# Patient Record
Sex: Female | Born: 1995 | Race: Black or African American | Hispanic: No | Marital: Single | Smoking: Never smoker
Health system: Southern US, Community
[De-identification: ages and names within clinical notes are randomized; demographics above are authoritative.]

## PROBLEM LIST (undated history)

## (undated) HISTORY — PX: APPENDECTOMY: SHX54

---

## 2019-08-21 ENCOUNTER — Other Ambulatory Visit: Payer: Self-pay

## 2019-08-21 ENCOUNTER — Other Ambulatory Visit (HOSPITAL_COMMUNITY)
Admission: RE | Admit: 2019-08-21 | Discharge: 2019-08-21 | Disposition: A | Discharge status: Home / Self Care | Payer: 59 | Source: Ambulatory Visit | Attending: Family Medicine | Admitting: Family Medicine

## 2019-08-21 ENCOUNTER — Encounter: Payer: Self-pay | Admitting: Family Medicine

## 2019-08-21 ENCOUNTER — Ambulatory Visit (INDEPENDENT_AMBULATORY_CARE_PROVIDER_SITE_OTHER): Payer: 59 | Admitting: Family Medicine

## 2019-08-21 DIAGNOSIS — N760 Acute vaginitis: Secondary | ICD-10-CM | POA: Diagnosis not present

## 2019-08-21 DIAGNOSIS — N912 Amenorrhea, unspecified: Secondary | ICD-10-CM | POA: Diagnosis not present

## 2019-08-21 DIAGNOSIS — Z113 Encounter for screening for infections with a predominantly sexual mode of transmission: Secondary | ICD-10-CM | POA: Insufficient documentation

## 2019-08-21 DIAGNOSIS — Z3A08 8 weeks gestation of pregnancy: Secondary | ICD-10-CM

## 2019-08-21 DIAGNOSIS — N898 Other specified noninflammatory disorders of vagina: Secondary | ICD-10-CM

## 2019-08-21 DIAGNOSIS — B9689 Other specified bacterial agents as the cause of diseases classified elsewhere: Secondary | ICD-10-CM

## 2019-08-21 LAB — POCT WET + KOH PREP
Trich by wet prep: ABSENT
Yeast by KOH: ABSENT
Yeast by wet prep: ABSENT

## 2019-08-21 MED ORDER — METRONIDAZOLE 0.75 % VA GEL: 1.0000 | Freq: Two times a day (BID) | VAGINAL | 0 refills | Status: DC

## 2019-08-21 NOTE — Patient Instructions (Addendum)
  Arrange to see an OB/GYN doctor in the near future.  Use the MetroGel vaginal 1 applicator full twice daily in the vagina for bacterial vaginosis  Continue taking your prenatal vitamins.  See your OB/GYN if further problems arise.  STD testing is being performed and will be back in a few days.  If you do not hear from [place] on that in the next week get back to [place].  If you have lab work done today you will be contacted with your lab results within the next 2 weeks.  If you have not heard from [place] then please contact us. The fastest way to get your results is to register for My Chart.   IF you received an x-ray today, you will receive an invoice from [facility]. Please contact [facility] at [phone] with questions or concerns regarding your invoice.   IF you received labwork today, you will receive an invoice from [facility]. Please contact [facility] at [phone] with questions or concerns regarding your invoice.   Our billing staff will not be able to assist you with questions regarding bills from these companies.  You will be contacted with the lab results as soon as they are available. The fastest way to get your results is to activate your My Chart account. Instructions are located on the last page of this paperwork. If you have not heard from [place] regarding the results in 2 weeks, please contact this office.

## 2019-08-21 NOTE — Progress Notes (Signed)
Patient ID: Jillian Hess, female    DOB: 1995/10/22  Age: 24 y.o. MRN: [id number]  Chief Complaint  Patient presents with  . Vaginal Discharge    2 weeks.     Subjective:   2-week history of vaginal discharge with a little bit of a fishy odor at times.  Sometimes it is more, sometimes less.  She is pregnant, probably from 8 to 12 weeks, not sure of her dates as to whether she had a November menstrual cycle or not.  Is no longer with the baby's father.  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  No history of STDs.  Current allergies, medications, problem list, past/family and social histories reviewed.  Objective:  LMP 05/21/2019   No acute distress.  Physical exam not done.  She did the self swab.  Assessment & Plan:   Assessment: 1. Vaginal discharge   2. Bacterial vaginosis   3. Amenorrhea   4. [redacted] weeks gestation of pregnancy   5. Screen for STD (sexually transmitted disease)       Plan: See instruction sheet.  Reviewed up-to-date and use of MetroGel vaginal should be fine.  She has some clue cells, not a lot, but I think her history is consistent with the BV.  Orders Placed This Encounter  Procedures  . HIV Antibody (routine testing w rflx)  . RPR  . POCT Wet + KOH Prep    Meds ordered this encounter  Medications  . metroNIDAZOLE (METROGEL) 0.75 % vaginal gel    Sig: Place 1 Applicatorful vaginally 2 (two) times daily.    Dispense:  70 g    Refill:  0         Patient Instructions    Arrange to see an OB/GYN doctor in the near future.  Use the MetroGel vaginal 1 applicator full twice daily in the vagina for bacterial vaginosis  Continue taking your prenatal vitamins.  See your OB/GYN if further problems arise.  STD testing is being performed and will be back in a few days.  If you do not hear from [place] on that in the next week get back to [place].  If you have lab  work done today you will be contacted with your lab results within the next 2 weeks.  If you have not heard from [place] then please contact us. The fastest way to get your results is to register for My Chart.   IF you received an x-ray today, you will receive an invoice from [facility]. Please contact [facility] at [phone] with questions or concerns regarding your invoice.   IF you received labwork today, you will receive an invoice from [facility]. Please contact [facility] at [phone] with questions or concerns regarding your invoice.   Our billing staff will not be able to assist you with questions regarding bills from these companies.  You will be contacted with the lab results as soon as they are available. The fastest way to get your results is to activate your My Chart account. Instructions are located on the last page of this paperwork. If you have not heard from [place] regarding the results in 2 weeks, please contact this office.         No follow-ups on file.   Janace Hoard, MD 08/21/2019

## 2019-08-22 LAB — HIV ANTIBODY (ROUTINE TESTING W REFLEX): HIV Screen 4th Generation wRfx: NONREACTIVE

## 2019-08-22 LAB — RPR: RPR Ser Ql: NONREACTIVE

## 2019-08-23 LAB — GC/CHLAMYDIA PROBE AMP (~~LOC~~) NOT AT ARMC
Chlamydia: NEGATIVE
Comment: NEGATIVE
Comment: NORMAL
Neisseria Gonorrhea: POSITIVE — AB

## 2019-08-25 ENCOUNTER — Other Ambulatory Visit: Payer: Self-pay

## 2019-08-25 ENCOUNTER — Encounter: Payer: Self-pay | Admitting: Adult Health Nurse Practitioner

## 2019-08-25 ENCOUNTER — Ambulatory Visit (INDEPENDENT_AMBULATORY_CARE_PROVIDER_SITE_OTHER): Payer: 59 | Admitting: Adult Health Nurse Practitioner

## 2019-08-25 VITALS — BP 121/79 | HR 95 | Temp 98.1°F | Ht 64.0 in | Wt 138.4 lb

## 2019-08-25 DIAGNOSIS — R1084 Generalized abdominal pain: Secondary | ICD-10-CM

## 2019-08-25 DIAGNOSIS — O98213 Gonorrhea complicating pregnancy, third trimester: Secondary | ICD-10-CM | POA: Diagnosis not present

## 2019-08-25 DIAGNOSIS — O98211 Gonorrhea complicating pregnancy, first trimester: Secondary | ICD-10-CM

## 2019-08-25 HISTORY — DX: Gonorrhea complicating pregnancy, first trimester: O98.211

## 2019-08-25 MED ORDER — CEFTRIAXONE SODIUM 250 MG IJ SOLR: 250.0000 mg | Freq: Once | INTRAMUSCULAR | Status: DC

## 2019-08-25 MED ORDER — CEFTRIAXONE SODIUM 1 G IJ SOLR: 500.0000 mg | Freq: Once | INTRAMUSCULAR | 0 refills | Status: AC

## 2019-08-25 MED ORDER — CEFTRIAXONE SODIUM 500 MG IJ SOLR
500.0000 mg | Freq: Once | INTRAMUSCULAR | Status: DC
  Administered 2019-08-25: 17:00:00 500 mg via INTRAMUSCULAR

## 2019-08-25 NOTE — Patient Instructions (Signed)
° ° ° °  If you have lab work done today you will be contacted with your lab results within the next 2 weeks.  If you have not heard from us then please contact us. The fastest way to get your results is to register for My Chart. ° ° °IF you received an x-ray today, you will receive an invoice from [facility]. Please contact [facility] at [phone] with questions or concerns regarding your invoice.  ° °IF you received labwork today, you will receive an invoice from [facility]. Please contact [facility] at [phone] with questions or concerns regarding your invoice.  ° °Our billing staff will not be able to assist you with questions regarding bills from these companies. ° °You will be contacted with the lab results as soon as they are available. The fastest way to get your results is to activate your My Chart account. Instructions are located on the last page of this paperwork. If you have not heard from us regarding the results in 2 weeks, please contact this office. °  ° ° ° °

## 2019-08-25 NOTE — Progress Notes (Signed)
   08/25/2019  Jillian Hess November 30, 1995 [id number]  Jillian Hess is an 24 y.o. female  Subjective:   Patient presents for a positive Gonorrhea test.  Per patient, the only person she has had a sexual encounter with is the father of her baby with whom she is no longer with.  She has already called him and informed him of the positive test and his need to be treated.  She is somewhat emotional from being blamed by him for the infection despite having him as her only partner.    She questions whether she should continue the Metrogel.  We discussed she could have both gonorrhea and BV.  Would get the antibiotic injection today and if she still has symptoms of discharge, will continue the metrogel.     Review of Systems  Constitutional: Negative for chills, diaphoresis, fatigue and fever.  Genitourinary: Positive for pelvic pain and vaginal discharge. Negative for dyspareunia, dysuria, frequency, genital sores, hematuria, menstrual problem, urgency, vaginal bleeding and vaginal pain.  Musculoskeletal: Negative for arthralgias, joint swelling and myalgias.  Skin: Negative.   Allergic/Immunologic: Negative for immunocompromised state.  Hematological: Negative.     Physical Exam  General appearance: oriented to person, place, and time. Mental Status: normal mood, behavior, speech, dress, motor activity, and thought processes, affect appropriate to mood, anxious.   Encounter Medications:   Outpatient Encounter Medications as of 08/25/2019  Medication Sig  . metroNIDAZOLE (METROGEL) 0.75 % vaginal gel Place 1 Applicatorful vaginally 2 (two) times daily.  . prenatal vitamin w/FE, FA (PRENATAL 1 + 1) 27-1 MG TABS tablet Take 1 tablet by mouth daily at 12 noon.   Facility-Administered Encounter Medications as of 08/25/2019  Medication  . cefTRIAXone (ROCEPHIN) injection 500 mg     Assessment:    1. Gonorrhea affecting pregnancy in third trimester   2. Generalized abdominal pain       Plan:   Meds ordered this encounter  Medications  . DISCONTD: cefTRIAXone (ROCEPHIN) injection 250 mg  . cefTRIAXone (ROCEPHIN) injection 500 mg   Will f/u in 1 week for TOC, will let OB know that she needs f/u testing at 3 months.  A total of 25 minutes were spent face-to-face with the patient during this encounter and over half of that time was spent on counseling and coordination of care.

## 2019-08-25 NOTE — Addendum Note (Signed)
Addended by: Trudie Reed A on: 08/25/2019 05:29 PM   Modules accepted: Orders

## 2019-08-29 ENCOUNTER — Telehealth: Payer: Self-pay | Admitting: Adult Health Nurse Practitioner

## 2019-08-29 NOTE — Telephone Encounter (Signed)
Spoke with pt regarding her concerns

## 2019-08-29 NOTE — Telephone Encounter (Signed)
Pt wants S. Bryd to give her a call, she will not tell me why. [phone]

## 2019-09-01 ENCOUNTER — Ambulatory Visit (INDEPENDENT_AMBULATORY_CARE_PROVIDER_SITE_OTHER): Payer: 59 | Admitting: Adult Health Nurse Practitioner

## 2019-09-01 ENCOUNTER — Encounter: Payer: Self-pay | Admitting: Adult Health Nurse Practitioner

## 2019-09-01 ENCOUNTER — Other Ambulatory Visit (HOSPITAL_COMMUNITY)
Admission: RE | Admit: 2019-09-01 | Discharge: 2019-09-01 | Disposition: A | Discharge status: Home / Self Care | Payer: 59 | Source: Ambulatory Visit | Attending: Adult Health Nurse Practitioner | Admitting: Adult Health Nurse Practitioner

## 2019-09-01 ENCOUNTER — Other Ambulatory Visit: Payer: Self-pay

## 2019-09-01 VITALS — BP 108/60 | HR 96 | Temp 98.2°F | Ht 64.0 in | Wt 136.8 lb

## 2019-09-01 DIAGNOSIS — A568 Sexually transmitted chlamydial infection of other sites: Secondary | ICD-10-CM

## 2019-09-01 DIAGNOSIS — O98211 Gonorrhea complicating pregnancy, first trimester: Secondary | ICD-10-CM

## 2019-09-01 DIAGNOSIS — O98213 Gonorrhea complicating pregnancy, third trimester: Secondary | ICD-10-CM | POA: Insufficient documentation

## 2019-09-01 LAB — POCT WET + KOH PREP
Trich by wet prep: ABSENT
Yeast by KOH: ABSENT
Yeast by wet prep: ABSENT

## 2019-09-01 NOTE — Progress Notes (Signed)
   09/01/2019  Jillian Hess [birth year] [id number]   History   Chief Complaint  Patient presents with  . Follow-up    Gc/Chlam    HPI   Jillian Hess presents for f/u on her Gonorrhea.  She is [redacted] weeks pregnant.  No complications.  No discomfort.  No dysuria, pelvic pain, vaginal burning or itching.  She has noticed a clay like discharge since being treated.  No bleeding.     OB History    Gravida  1   Para      Term      Preterm      AB      Living        SAB      TAB      Ectopic      Multiple      Live Births             Review of Systems   Review of Systems See HPI Constitution: No fevers or chills No malaise No diaphoresis Skin: No rash or itching Eyes: no blurry vision, no double vision Genito-Urinary ROS: no dysuria, trouble voiding, or hematuria Neuro: no dizziness or headaches   Allergies   Patient has no known allergies.  Home Medications    Current Outpatient Medications:  .  metroNIDAZOLE (METROGEL) 0.75 % vaginal gel, Place 1 Applicatorful vaginally 2 (two) times daily., Disp: 70 g, Rfl: 0 .  prenatal vitamin w/FE, FA (PRENATAL 1 + 1) 27-1 MG TABS tablet, Take 1 tablet by mouth daily at 12 noon., Disp: , Rfl:   Meds Ordered and Administered this Visit  No orders of the defined types were placed in this encounter.   BP 108/60 (BP Location: Left Arm, Patient Position: Sitting, Cuff Size: Normal)   Pulse 96   Temp 98.2 F (36.8 C) (Temporal)   Ht 5\' 4"  (1.626 m)   Wt 136 lb 12.8 oz (62.1 kg)   LMP 05/21/2019   SpO2 98%   BMI 23.48 kg/m   Physical Exam   GEN: WDWN, NAD, Non-toxic, Alert & Oriented x 3 HEENT: Atraumatic, Normocephalic.  Ears and Nose: No external deformity. EXTR: No clubbing/cyanosis/edema NEURO: Normal gait.  PSYCH: Normally interactive. Conversant. Not depressed or anxious appearing.  Calm demeanor.      MDM   1. Gonorrhea affecting pregnancy in third trimester    Orders Placed This  Encounter  Procedures  . POCT Wet + KOH Prep    Will f/u pending results.  The patient and I spent about 10 minutes discussing her pregnancy and medications to use for nausea if needed as well as Tylenol for any discomfort.  We discussed foods during pregnancy and briefly discussed the support in her life which is her family and friends.   A total of 20 minutes were spent face-to-face with the patient during this encounter and over half of that time was spent on counseling and coordination of care.

## 2019-09-04 ENCOUNTER — Other Ambulatory Visit: Payer: Self-pay

## 2019-09-04 ENCOUNTER — Inpatient Hospital Stay (HOSPITAL_COMMUNITY)
Admission: AD | Admit: 2019-09-04 | Discharge: 2019-09-04 | Disposition: A | Discharge status: Home / Self Care | Payer: 59 | Attending: Obstetrics and Gynecology | Admitting: Obstetrics and Gynecology

## 2019-09-04 ENCOUNTER — Inpatient Hospital Stay (HOSPITAL_COMMUNITY): Payer: 59

## 2019-09-04 ENCOUNTER — Encounter (HOSPITAL_COMMUNITY): Payer: Self-pay | Admitting: Obstetrics and Gynecology

## 2019-09-04 DIAGNOSIS — O26891 Other specified pregnancy related conditions, first trimester: Secondary | ICD-10-CM | POA: Diagnosis not present

## 2019-09-04 DIAGNOSIS — N898 Other specified noninflammatory disorders of vagina: Secondary | ICD-10-CM | POA: Diagnosis not present

## 2019-09-04 DIAGNOSIS — R8290 Unspecified abnormal findings in urine: Secondary | ICD-10-CM | POA: Insufficient documentation

## 2019-09-04 DIAGNOSIS — R102 Pelvic and perineal pain: Secondary | ICD-10-CM | POA: Diagnosis present

## 2019-09-04 DIAGNOSIS — O209 Hemorrhage in early pregnancy, unspecified: Secondary | ICD-10-CM

## 2019-09-04 DIAGNOSIS — Z3A10 10 weeks gestation of pregnancy: Secondary | ICD-10-CM

## 2019-09-04 LAB — URINALYSIS, ROUTINE W REFLEX MICROSCOPIC
Bilirubin Urine: NEGATIVE
Glucose, UA: NEGATIVE mg/dL
Ketones, ur: 5 mg/dL — AB
Nitrite: NEGATIVE
Protein, ur: NEGATIVE mg/dL
Specific Gravity, Urine: 1.017 (ref 1.005–1.030)
pH: 7 (ref 5.0–8.0)

## 2019-09-04 LAB — CBC
HCT: 34.9 % — ABNORMAL LOW (ref 36.0–46.0)
Hemoglobin: 11.9 g/dL — ABNORMAL LOW (ref 12.0–15.0)
MCH: 32.1 pg (ref 26.0–34.0)
MCHC: 34.1 g/dL (ref 30.0–36.0)
MCV: 94.1 fL (ref 80.0–100.0)
Platelets: 178 10*3/uL (ref 150–400)
RBC: 3.71 MIL/uL — ABNORMAL LOW (ref 3.87–5.11)
RDW: 13.2 % (ref 11.5–15.5)
WBC: 6.7 10*3/uL (ref 4.0–10.5)
nRBC: 0 % (ref 0.0–0.2)

## 2019-09-04 LAB — WET PREP, GENITAL
Clue Cells Wet Prep HPF POC: NONE SEEN
Sperm: NONE SEEN
Trich, Wet Prep: NONE SEEN
Yeast Wet Prep HPF POC: NONE SEEN

## 2019-09-04 LAB — ABO/RH: ABO/RH(D): B POS

## 2019-09-04 LAB — POCT PREGNANCY, URINE: Preg Test, Ur: POSITIVE — AB

## 2019-09-04 LAB — HCG, QUANTITATIVE, PREGNANCY: hCG, Beta Chain, Quant, S: 242801 m[IU]/mL — ABNORMAL HIGH (ref <5)

## 2019-09-04 MED ORDER — ACETAMINOPHEN 325 MG PO TABS: 650.0000 mg | ORAL_TABLET | Freq: Four times a day (QID) | ORAL | 3 refills | Status: AC | PRN

## 2019-09-04 MED ORDER — ACETAMINOPHEN 325 MG PO TABS
650.0000 mg | ORAL_TABLET | Freq: Four times a day (QID) | ORAL | Status: DC | PRN
  Administered 2019-09-04: 650 mg via ORAL
  Filled 2019-09-04: qty 2

## 2019-09-04 NOTE — MAU Note (Signed)
Pt is a G1P0 at 10.1 weeks per [place] at 6 weeks.  Pt has had brown "chocolate syrup" discharge and pelvic pain for the last week.  Today the discharge increased.

## 2019-09-04 NOTE — ED Provider Notes (Signed)
MSE was initiated and I personally evaluated the patient and placed orders (if any) at  11:42 AM on September 04, 2019.  The patient appears stable so that the remainder of the MSE may be completed by another provider.  I spoke with Sam from MAU.  Patient is stable and vitals are normal and appears well.  Patient to be transferred over to MAU for further care   Arlyn Dunning, PA-C 09/04/19 1142    Arby Barrette, MD 09/11/19 1436

## 2019-09-04 NOTE — ED Triage Notes (Signed)
Pt reports brown colored vaginal discharge for the past 1.5 weeks as well as pelvic pain for 1 week. Pt about [redacted] weeks pregnant. Pt denies n/v.

## 2019-09-04 NOTE — MAU Note (Signed)
Sent up from ER, early preg with pain.  Has been seen at [facility] clinic with preg 3times last month.  Has had [place] at [facility] 8/29 from that. Unsure of LMP.  Having pelvic pain, start about 10days ago,constant. No bleeding, says d/c looks like chocolate syrup, "dark brown and slimy"

## 2019-09-04 NOTE — MAU Provider Note (Addendum)
History    Chief Complaint  Patient presents with  . Pelvic Pain  . Vaginal Discharge   Jillian Hess a 24 yo G1P0 woman presents today due to pelvic pain and vaginal discharge x 1.5 weeks. She describes the pelvic pain as low and feels like she's "fallen down stairs;" her discharge is thick and dark brown, she describes it like "chocolate syrup." She is 10w pregnant and was sexually active with 1 partner. She was diagnosed with gonorrhea earlier this month by family medicine and was treated with Ceftriaxone 500 mg IM x1 and metrogel for suspected BV, which she used for 3-4 days. Her LMP that she is sure of was in October, she cannot remember if she had a menstrual period in November. 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 Patient initially had a home pregnancy test on 12/17, and then had 2 [place] at [facility] on 08/08/19 and 08/15/19. PMH is otherwise non contributory, appendectomy in 2014 or 2015, NKDA, and her only medication is PNV.   Pertinent Gynecological History: Menses: irregular occurring approximately every 30-35 days without intermenstrual spotting Bleeding: n/a Contraception: none DES exposure: denies Blood transfusions: none Sexually transmitted diseases: recent diagnosis: gonorrhea in January 2021 Previous GYN Procedures: none  Last mammogram: n/a Last pap: n/a   Past Medical History:  Diagnosis Date  . Gonorrhea affecting pregnancy in first trimester 08/25/2019    History reviewed. No pertinent surgical history.  History reviewed. No pertinent family history.  Social History   Tobacco Use  . Smoking status: Never Smoker  . Smokeless tobacco: Never Used  Substance Use Topics  . Alcohol use: Not Currently  . Drug use: Not on file    Allergies: No Known Allergies  Medications Prior to Admission  Medication Sig Dispense Refill Last Dose  . metroNIDAZOLE  (METROGEL) 0.75 % vaginal gel Place 1 Applicatorful vaginally 2 (two) times daily. 70 g 0   . prenatal vitamin w/FE, FA (PRENATAL 1 + 1) 27-1 MG TABS tablet Take 1 tablet by mouth daily at 12 noon.       Review of Systems  Constitutional: Negative.   HENT: Negative.   Eyes: Negative.   Respiratory: Negative.   Cardiovascular: Negative.   Gastrointestinal: Negative for abdominal distention, abdominal pain, constipation, nausea and vomiting.  Endocrine: Negative.   Genitourinary: Positive for frequency, pelvic pain and vaginal discharge. Negative for dysuria, hematuria, urgency, vaginal bleeding and vaginal pain.  Musculoskeletal: Negative.   Skin: Negative.   Allergic/Immunologic: Negative.   Neurological: Negative.   Hematological: Negative.   Psychiatric/Behavioral: Negative.    Physical Exam Blood pressure 116/77, pulse 78, temperature 98.2 F (36.8 C), temperature source Oral, resp. rate 17, height 5\' 4"  (1.626 m), weight 62.5 kg, last menstrual period 05/21/2019, SpO2 100 %. Physical Exam  Constitutional: She appears well-developed and well-nourished. No distress.  HENT:  Head: Normocephalic and atraumatic.  Eyes: Conjunctivae are normal.  Cardiovascular: Normal rate, regular rhythm and normal heart sounds. Exam reveals no gallop and no friction rub.  No murmur heard. Respiratory: Effort normal and breath sounds normal. No respiratory distress. She has no wheezes. She has no rales.  GI: Soft. Bowel sounds are normal. She exhibits no distension. There is no abdominal tenderness. There is no rebound and no guarding.  Genitourinary:    Genitourinary Comments: PELVIC:  Normal appearing external female genitalia, normal vaginal epithelium, copious grayish discharge. Normal  appearing cervix, closed external os    Skin: She is not diaphoretic.   CBC    Component Value Date/Time   WBC 6.7 09/04/2019 1223   RBC 3.71 (L) 09/04/2019 1223   HGB 11.9 (L) 09/04/2019 1223   HCT 34.9  (L) 09/04/2019 1223   PLT 178 09/04/2019 1223   MCV 94.1 09/04/2019 1223   MCH 32.1 09/04/2019 1223   MCHC 34.1 09/04/2019 1223   RDW 13.2 09/04/2019 1223    Urinalysis    Component Value Date/Time   COLORURINE YELLOW 09/04/2019 1206   APPEARANCEUR CLOUDY (A) 09/04/2019 1206   LABSPEC 1.017 09/04/2019 1206   PHURINE 7.0 09/04/2019 1206   GLUCOSEU NEGATIVE 09/04/2019 1206   HGBUR MODERATE (A) 09/04/2019 1206   BILIRUBINUR NEGATIVE 09/04/2019 1206   KETONESUR 5 (A) 09/04/2019 1206   PROTEINUR NEGATIVE 09/04/2019 1206   NITRITE NEGATIVE 09/04/2019 1206   LEUKOCYTESUR MODERATE (A) 09/04/2019 1206   US OB Comp Less 14 Wks  Result Date: 09/04/2019 CLINICAL DATA:  Pregnant patient with vaginal bleeding for 1 week. Exam for ultrasound dating. EXAM: OBSTETRIC <14 WK ULTRASOUND TECHNIQUE: Transabdominal ultrasound was performed for evaluation of the gestation as well as the maternal uterus and adnexal regions. COMPARISON:  None. FINDINGS: Intrauterine gestational sac: Single. Yolk sac:  Visualized. Embryo:  Visualized. Cardiac Activity: Detected. Heart Rate: 171 bpm CRL: 36.1  mm   10 w 4 d                  [place] EDC: 03/28/2020 Subchorionic hemorrhage:  None visualized. Maternal uterus/adnexae: Corpus luteum cyst on the left noted. IMPRESSION: Single living intrauterine pregnancy.  No acute finding. Electronically Signed   By: Drusilla Kanner M.D.   On: 09/04/2019 13:58    MAU Course Vaginal discharge in first trimester pregnancy Closed external os. GC/CT pending. Wet prep without yeast, BV, or trichomonas, but many WBCs.  Acetaminophen given with improvement in pelvic pain.   US reveals normal intrauterine pregnancy. Patient ok for discharge and to follow up at initial prenatal visit on 09/13/19 at [facility].  Shirlean Mylar, MD [facility] Residency, PGY-1  I confirm that I have verified the information documented in the resident's note and that I have also personally  supervised and reperformed the history, physical exam and all medical decision making activities of this service and have verified that all service and findings are accurately documented in this student's note.   --SIUP at [redacted]w[redacted]d  --GCC pending --Blood type B POS --Abnormal UA with squam epithelial cells present, urine culture ordered --Discharge in stable condition  F/U: --New OB [facility]   [place], [place] 09/04/2019 3:10 PM

## 2019-09-04 NOTE — Discharge Instructions (Signed)
First Trimester of Pregnancy  The first trimester of pregnancy is from week 1 until the end of week 13 (months 1 through 3). During this time, your baby will begin to develop inside you. At 6-8 weeks, the eyes and face are formed, and the heartbeat can be seen on ultrasound. At the end of 12 weeks, all the baby's organs are formed. Prenatal care is all the medical care you receive before the birth of your baby. Make sure you get good prenatal care and follow all of your doctor's instructions. Follow these instructions at home: Medicines  Take over-the-counter and prescription medicines only as told by your doctor. Some medicines are safe and some medicines are not safe during pregnancy.  Take a prenatal vitamin that contains at least 600 micrograms (mcg) of folic acid.  If you have trouble pooping (constipation), take medicine that will make your stool soft (stool softener) if your doctor approves. Eating and drinking   Eat regular, healthy meals.  Your doctor will tell you the amount of weight gain that is right for you.  Avoid raw meat and uncooked cheese.  If you feel sick to your stomach (nauseous) or throw up (vomit): ? Eat 4 or 5 small meals a day instead of 3 large meals. ? Try eating a few soda crackers. ? Drink liquids between meals instead of during meals.  To prevent constipation: ? Eat foods that are high in fiber, like fresh fruits and vegetables, whole grains, and beans. ? Drink enough fluids to keep your pee (urine) clear or pale yellow. Activity  Exercise only as told by your doctor. Stop exercising if you have cramps or pain in your lower belly (abdomen) or low back.  Do not exercise if it is too hot, too humid, or if you are in a place of great height (high altitude).  Try to avoid standing for long periods of time. Move your legs often if you must stand in one place for a long time.  Avoid heavy lifting.  Wear low-heeled shoes. Sit and stand up  straight.  You can have sex unless your doctor tells you not to. Relieving pain and discomfort  Wear a good support bra if your breasts are sore.  Take warm water baths (sitz baths) to soothe pain or discomfort caused by hemorrhoids. Use hemorrhoid cream if your doctor says it is okay.  Rest with your legs raised if you have leg cramps or low back pain.  If you have puffy, bulging veins (varicose veins) in your legs: ? Wear support hose or compression stockings as told by your doctor. ? Raise (elevate) your feet for 15 minutes, 3-4 times a day. ? Limit salt in your food. Prenatal care  Schedule your prenatal visits by the twelfth week of pregnancy.  Write down your questions. Take them to your prenatal visits.  Keep all your prenatal visits as told by your doctor. This is important. Safety  Wear your seat belt at all times when driving.  Make a list of emergency phone numbers. The list should include numbers for family, friends, the hospital, and police and fire departments. General instructions  Ask your doctor for a referral to a local prenatal class. Begin classes no later than at the start of month 6 of your pregnancy.  Ask for help if you need counseling or if you need help with nutrition. Your doctor can give you advice or tell you where to go for help.  Do not use hot tubs, steam   rooms, or saunas.  Do not douche or use tampons or scented sanitary pads.  Do not cross your legs for long periods of time.  Avoid all herbs and alcohol. Avoid drugs that are not approved by your doctor.  Do not use any tobacco products, including cigarettes, chewing tobacco, and electronic cigarettes. If you need help quitting, ask your doctor. You may get counseling or other support to help you quit.  Avoid cat litter boxes and soil used by cats. These carry germs that can cause birth defects in the baby and can cause a loss of your baby (miscarriage) or stillbirth.  Visit your dentist.  At home, brush your teeth with a soft toothbrush. Be gentle when you floss. Contact a doctor if:  You are dizzy.  You have mild cramps or pressure in your lower belly.  You have a nagging pain in your belly area.  You continue to feel sick to your stomach, you throw up, or you have watery poop (diarrhea).  You have a bad smelling fluid coming from your vagina.  You have pain when you pee (urinate).  You have increased puffiness (swelling) in your face, hands, legs, or ankles. Get help right away if:  You have a fever.  You are leaking fluid from your vagina.  You have spotting or bleeding from your vagina.  You have very bad belly cramping or pain.  You gain or lose weight rapidly.  You throw up blood. It may look like coffee grounds.  You are around people who have German measles, fifth disease, or chickenpox.  You have a very bad headache.  You have shortness of breath.  You have any kind of trauma, such as from a fall or a car accident. Summary  The first trimester of pregnancy is from week 1 until the end of week 13 (months 1 through 3).  To take care of yourself and your unborn baby, you will need to eat healthy meals, take medicines only if your doctor tells you to do so, and do activities that are safe for you and your baby.  Keep all follow-up visits as told by your doctor. This is important as your doctor will have to ensure that your baby is healthy and growing well. This information is not intended to replace advice given to you by your health care provider. Make sure you discuss any questions you have with your health care provider. Document Revised: 11/10/2018 Document Reviewed: 07/28/2016 Elsevier Patient Education  2020 [employer]  Safe Medications in Pregnancy   Acne: Benzoyl Peroxide Salicylic Acid  Backache/Headache: Tylenol: 2 regular strength every 4 hours OR              2 Extra strength every 6  hours  Colds/Coughs/Allergies: Benadryl (alcohol free) 25 mg every 6 hours as needed Breath right strips Claritin Cepacol throat lozenges Chloraseptic throat spray Cold-Eeze- up to three times per day Cough drops, alcohol free Flonase (by prescription only) Guaifenesin Mucinex Robitussin DM (plain only, alcohol free) Saline nasal spray/drops Sudafed (pseudoephedrine) & Actifed ** use only after [redacted] weeks gestation and if you do not have high blood pressure Tylenol Vicks Vaporub Zinc lozenges Zyrtec   Constipation: Colace Ducolax suppositories Fleet enema Glycerin suppositories Metamucil Milk of magnesia Miralax Senokot Smooth move tea  Diarrhea: Kaopectate Imodium A-D  *NO pepto Bismol  Hemorrhoids: Anusol Anusol HC Preparation H Tucks  Indigestion: Tums Maalox Mylanta Zantac  Pepcid  Insomnia: Benadryl (alcohol free) 25mg every 6 hours as needed   Tylenol PM Unisom, no Gelcaps  Leg Cramps: Tums MagGel  Nausea/Vomiting:  Bonine Dramamine Emetrol Ginger extract Sea bands Meclizine  Nausea medication to take during pregnancy:  Unisom (doxylamine succinate 25 mg tablets) Take one tablet daily at bedtime. If symptoms are not adequately controlled, the dose can be increased to a maximum recommended dose of two tablets daily (1/2 tablet in the morning, 1/2 tablet mid-afternoon and one at bedtime). Vitamin B6 100mg tablets. Take one tablet twice a day (up to 200 mg per day).  Skin Rashes: Aveeno products Benadryl cream or 25mg every 6 hours as needed Calamine Lotion 1% cortisone cream  Yeast infection: Gyne-lotrimin 7 Monistat 7   **If taking multiple medications, please check labels to avoid duplicating the same active ingredients **take medication as directed on the label ** Do not exceed 4000 mg of tylenol in 24 hours **Do not take medications that contain aspirin or ibuprofen   

## 2019-09-05 ENCOUNTER — Ambulatory Visit (INDEPENDENT_AMBULATORY_CARE_PROVIDER_SITE_OTHER): Payer: 59

## 2019-09-05 DIAGNOSIS — Z3401 Encounter for supervision of normal first pregnancy, first trimester: Secondary | ICD-10-CM

## 2019-09-05 DIAGNOSIS — Z3400 Encounter for supervision of normal first pregnancy, unspecified trimester: Secondary | ICD-10-CM | POA: Insufficient documentation

## 2019-09-05 LAB — GC/CHLAMYDIA PROBE AMP (~~LOC~~) NOT AT ARMC
Chlamydia: NEGATIVE
Chlamydia: NEGATIVE
Comment: NEGATIVE
Comment: NORMAL
Comment: NEGATIVE
Comment: NORMAL
Neisseria Gonorrhea: NEGATIVE
Neisseria Gonorrhea: NEGATIVE

## 2019-09-05 LAB — CULTURE, OB URINE: Culture: <10000 — AB

## 2019-09-05 MED ORDER — BLOOD PRESSURE KIT DEVI: 1.0000 | 0 refills | Status: AC

## 2019-09-05 NOTE — Progress Notes (Signed)
I connected with  Jillian Hess on 09/05/19 by a video enabled telemedicine application and verified that I am speaking with the correct person using two identifiers.   I discussed the limitations of evaluation and management by telemedicine. The patient expressed understanding and agreed to proceed.  PRENATAL INTAKE SUMMARY  Jillian Hess presents today New OB Nurse Interview.  OB History    Gravida  1   Para  0   Term      Preterm      AB      Living        SAB      TAB      Ectopic      Multiple      Live Births             I have reviewed the patient's medical, obstetrical, social, and family histories, medications, and available lab results.  SUBJECTIVE She has no unusual complaints  OBJECTIVE Initial Physical Exam (New OB)  GENERAL APPEARANCE: alert, well appearing, oriented to person, place and time   ASSESSMENT Normal pregnancy  PLAN Prenatal care BP CUFF Ordered

## 2019-09-13 ENCOUNTER — Other Ambulatory Visit (HOSPITAL_COMMUNITY)
Admission: RE | Admit: 2019-09-13 | Discharge: 2019-09-13 | Disposition: A | Discharge status: Home / Self Care | Payer: 59 | Source: Ambulatory Visit | Attending: Advanced Practice Midwife | Admitting: Advanced Practice Midwife

## 2019-09-13 ENCOUNTER — Ambulatory Visit (INDEPENDENT_AMBULATORY_CARE_PROVIDER_SITE_OTHER): Payer: Medicaid Other | Admitting: Advanced Practice Midwife

## 2019-09-13 ENCOUNTER — Encounter: Payer: Self-pay | Admitting: Advanced Practice Midwife

## 2019-09-13 ENCOUNTER — Other Ambulatory Visit: Payer: Self-pay

## 2019-09-13 DIAGNOSIS — O98211 Gonorrhea complicating pregnancy, first trimester: Secondary | ICD-10-CM

## 2019-09-13 DIAGNOSIS — Z3400 Encounter for supervision of normal first pregnancy, unspecified trimester: Secondary | ICD-10-CM

## 2019-09-13 DIAGNOSIS — Z3481 Encounter for supervision of other normal pregnancy, first trimester: Secondary | ICD-10-CM

## 2019-09-13 DIAGNOSIS — Z3A31 31 weeks gestation of pregnancy: Secondary | ICD-10-CM

## 2019-09-13 NOTE — Progress Notes (Signed)
NEW OB.  Declined FLU vaccine.  Reports no problems today.

## 2019-09-13 NOTE — Progress Notes (Signed)
History:   Alene Bergerson is a 24 y.o. G1P0 at 48w3dby 10 week ultrasound in [facility]  being seen today for her first obstetrical visit.  Her obstetrical history is significant for hx of STI. Patient does intend to breast feed and is interested in water birth. Pregnancy history fully reviewed.  Patient reports no complaints. She is not currently sexually active. She lives with three roommates and her sister is her primary support person. She is a non-smoker, student, having difficulty making time for self-care during [place] feeling very positive about her pregnancy.      HISTORY: OB History  Gravida Para Term Preterm AB Living  1 0 0 0 0 0  SAB TAB Ectopic Multiple Live Births  0 0 0 0 0    # Outcome Date GA Lbr Len/2nd Weight Sex Delivery Anes PTL Lv  1 Current            No pap history (age 24  Past Medical History:  Diagnosis Date  . Gonorrhea affecting pregnancy in first trimester 08/25/2019   Past Surgical History:  Procedure Laterality Date  . APPENDECTOMY     Family History  Problem Relation Age of Onset  . Cancer Maternal Grandfather    Social History   Tobacco Use  . Smoking status: Never Smoker  . Smokeless tobacco: Never Used  Substance Use Topics  . Alcohol use: Not Currently  . Drug use: Never   No Known Allergies Current Outpatient Medications on File Prior to Visit  Medication Sig Dispense Refill  . Blood Pressure Monitoring (BLOOD PRESSURE KIT) DEVI 1 kit by Does not apply route once a week. Check Blood Pressure regularly and record readings into the Babyscripts App.  Large Cuff.  DX O90.0 1 each 0  . prenatal vitamin w/FE, FA (PRENATAL 1 + 1) 27-1 MG TABS tablet Take 1 tablet by mouth daily at 12 noon.    .Marland Kitchenacetaminophen (TYLENOL) 325 MG tablet Take 2 tablets (650 mg total) by mouth every 6 (six) hours as needed for moderate pain. (Patient not taking: Reported on 09/13/2019) 30 tablet 3  . metroNIDAZOLE (METROGEL) 0.75 % vaginal gel Place 1 Applicatorful  vaginally 2 (two) times daily. (Patient not taking: Reported on 09/05/2019) 70 g 0   No current facility-administered medications on file prior to visit.    Review of Systems Pertinent items noted in HPI and remainder of comprehensive ROS otherwise negative. Physical Exam:   Vitals:   09/13/19 0943  BP: 128/78  Pulse: 94  Temp: 98.6 F (37 C)  Weight: 141 lb (64 kg)     Uterus:     Pelvic Exam: Perineum: no hemorrhoids, normal perineum   Vulva: normal external genitalia, no lesions   Vagina:  normal mucosa, normal discharge   Cervix: no lesions and normal, pap smear done.    Adnexa: normal adnexa and no mass, fullness, tenderness   Bony Pelvis: average  System: General: well-developed, well-nourished female in no acute distress   Breasts:  normal appearance, no masses or tenderness bilaterally   Skin: normal coloration and turgor, no rashes   Neurologic: oriented, normal, negative, normal mood   Extremities: normal strength, tone, and muscle mass, ROM of all joints is normal   HEENT PERRLA, extraocular movement intact and sclera clear, anicteric   Mouth/Teeth mucous membranes moist, pharynx normal without lesions and dental hygiene good   Neck supple and no masses   Cardiovascular: regular rate and rhythm   Respiratory:  no  respiratory distress, normal breath sounds   Abdomen: soft, non-tender; bowel sounds normal; no masses,  no organomegaly  Bedside Ultrasound for FHR check: Patient informed that the ultrasound is considered a limited obstetric ultrasound and is not intended to be a complete ultrasound exam.  Patient also informed that the ultrasound is not being completed with the intent of assessing for fetal or placental anomalies or any pelvic abnormalities.  Explained that the purpose of today's ultrasound is to assess for fetal heart rate.  Patient acknowledges the purpose of the exam and the limitations of the study.    FHT 165, very active movement noted and visible to  patient!!   Assessment:    Pregnancy: G1P0 Patient Active Problem List   Diagnosis Date Noted  . Supervision of normal first pregnancy 09/05/2019  . Gonorrhea affecting pregnancy in first trimester 08/25/2019     Plan:    1. Supervision of normal first pregnancy, antepartum - LOB - Previously given first trimester precautions and safe medications in pregnancy  - Enroll Patient in Babyscripts - Babyscripts Schedule Optimization - Obstetric Panel, Including HIV - Genetic Screening - Culture, OB Urine - Cytology - PAP( [place]) - [place] MFM OB COMP + 14 WK; Future - Cervicovaginal ancillary only( [place])  2. Gonorrhea affecting pregnancy in first trimester - Negative TOC in [facility] 09/04/2019  Initial labs drawn. Continue prenatal vitamins. Genetic Screening discussed, First trimester screen, Quad screen and NIPS: ordered. Ultrasound discussed; fetal anatomic survey: ordered. Problem list reviewed and updated. The nature of [place] with multiple MDs and other Advanced Practice Providers was explained to patient; also emphasized that residents, students are part of our team. Routine obstetric precautions reviewed. Return in about 4 weeks (around 10/11/2019) for Virtual, midwife preferred.    Mallie Snooks, MSN, CNM Certified Nurse Midwife, [employer] for [employer], [facility] 09/13/19 12:08 PM

## 2019-09-13 NOTE — Patient Instructions (Addendum)

## 2019-09-14 ENCOUNTER — Other Ambulatory Visit: Payer: Self-pay | Admitting: Advanced Practice Midwife

## 2019-09-14 ENCOUNTER — Encounter: Payer: Self-pay | Admitting: Advanced Practice Midwife

## 2019-09-14 DIAGNOSIS — A599 Trichomoniasis, unspecified: Secondary | ICD-10-CM

## 2019-09-14 LAB — CERVICOVAGINAL ANCILLARY ONLY
Bacterial Vaginitis (gardnerella): NEGATIVE
Candida Glabrata: NEGATIVE
Candida Vaginitis: NEGATIVE
Chlamydia: NEGATIVE
Comment: NEGATIVE
Comment: NEGATIVE
Comment: NEGATIVE
Comment: NEGATIVE
Comment: NEGATIVE
Comment: NORMAL
Neisseria Gonorrhea: NEGATIVE
Trichomonas: POSITIVE — AB

## 2019-09-14 LAB — OBSTETRIC PANEL, INCLUDING HIV
Antibody Screen: NEGATIVE
Basophils Absolute: 0 10*3/uL (ref 0.0–0.2)
Basos: 0 %
EOS (ABSOLUTE): 0 10*3/uL (ref 0.0–0.4)
Eos: 1 %
HIV Screen 4th Generation wRfx: NONREACTIVE
Hematocrit: 35.9 % (ref 34.0–46.6)
Hemoglobin: 12.3 g/dL (ref 11.1–15.9)
Hepatitis B Surface Ag: NEGATIVE
Immature Grans (Abs): 0 10*3/uL (ref 0.0–0.1)
Immature Granulocytes: 0 %
Lymphocytes Absolute: 1.1 10*3/uL (ref 0.7–3.1)
Lymphs: 14 %
MCH: 32.4 pg (ref 26.6–33.0)
MCHC: 34.3 g/dL (ref 31.5–35.7)
MCV: 95 fL (ref 79–97)
Monocytes Absolute: 0.4 10*3/uL (ref 0.1–0.9)
Monocytes: 5 %
Neutrophils Absolute: 6.2 10*3/uL (ref 1.4–7.0)
Neutrophils: 80 %
Platelets: 178 10*3/uL (ref 150–450)
RBC: 3.8 x10E6/uL (ref 3.77–5.28)
RDW: 13.1 % (ref 11.7–15.4)
RPR Ser Ql: NONREACTIVE
Rh Factor: POSITIVE
Rubella Antibodies, IGG: 8.27 index (ref >0.99)
WBC: 7.8 10*3/uL (ref 3.4–10.8)

## 2019-09-14 LAB — CYTOLOGY - PAP
Comment: NEGATIVE
Diagnosis: NEGATIVE
High risk HPV: NEGATIVE

## 2019-09-14 MED ORDER — METRONIDAZOLE 500 MG PO TABS: ORAL_TABLET | ORAL | 0 refills | Status: DC

## 2019-09-14 NOTE — Progress Notes (Signed)
+   Trichomonas at new ob.  Notification via phone. Patient verbalized concern that previous swabs have not been positive for Trich and she has been abstinent for about 40 days. Discussed with patient that method used at [facility] is reliable, low suspicion for false position. Treatment advised. Patient agreeable at end of phone call.  Clayton Bibles, MSN, CNM Certified Nurse Midwife, [facility] for [employer], [facility] 09/14/19 12:48 PM

## 2019-09-14 NOTE — Progress Notes (Unsigned)
See separate note.

## 2019-09-17 LAB — URINE CULTURE, OB REFLEX

## 2019-09-17 LAB — CULTURE, OB URINE

## 2019-09-25 ENCOUNTER — Encounter: Payer: Self-pay | Admitting: Advanced Practice Midwife

## 2019-09-26 ENCOUNTER — Encounter: Payer: Self-pay | Admitting: Advanced Practice Midwife

## 2019-10-12 ENCOUNTER — Other Ambulatory Visit: Payer: Self-pay

## 2019-10-12 ENCOUNTER — Other Ambulatory Visit (HOSPITAL_COMMUNITY)
Admission: RE | Admit: 2019-10-12 | Discharge: 2019-10-12 | Disposition: A | Discharge status: Home / Self Care | Payer: 59 | Source: Ambulatory Visit | Attending: Certified Nurse Midwife | Admitting: Certified Nurse Midwife

## 2019-10-12 ENCOUNTER — Ambulatory Visit (INDEPENDENT_AMBULATORY_CARE_PROVIDER_SITE_OTHER): Payer: 59 | Admitting: Advanced Practice Midwife

## 2019-10-12 VITALS — BP 119/74 | HR 109 | Wt 140.4 lb

## 2019-10-12 DIAGNOSIS — O23591 Infection of other part of genital tract in pregnancy, first trimester: Secondary | ICD-10-CM

## 2019-10-12 DIAGNOSIS — A5901 Trichomonal vulvovaginitis: Secondary | ICD-10-CM | POA: Insufficient documentation

## 2019-10-12 DIAGNOSIS — Z3A15 15 weeks gestation of pregnancy: Secondary | ICD-10-CM

## 2019-10-12 DIAGNOSIS — Z3400 Encounter for supervision of normal first pregnancy, unspecified trimester: Secondary | ICD-10-CM

## 2019-10-12 NOTE — Progress Notes (Signed)
   PRENATAL VISIT NOTE  Subjective:  Jillian Hess is a 24 y.o. G1P0 at [redacted]w[redacted]d being seen today for ongoing prenatal care.  She is currently monitored for the following issues for this low-risk pregnancy and has Gonorrhea affecting pregnancy in first trimester; Supervision of normal first pregnancy; and Trichomonas infection on their problem list.  Patient reports no complaints.  Contractions: Not present. Vag. Bleeding: None.   . Denies leaking of fluid.   The following portions of the patient's history were reviewed and updated as appropriate: allergies, current medications, past family history, past medical history, past social history, past surgical history and problem list.   Objective:   Vitals:   10/12/19 0858  BP: 119/74  Pulse: (!) 109  Weight: 140 lb 6.4 oz (63.7 kg)    Fetal Status:           General:  Alert, oriented and cooperative. Patient is in no acute distress.  Skin: Skin is warm and dry. No rash noted.   Cardiovascular: Normal heart rate noted  Respiratory: Normal respiratory effort, no problems with respiration noted  Abdomen: Soft, gravid, appropriate for gestational age.  Pain/Pressure: Absent     Pelvic: Cervical exam deferred        Extremities: Normal range of motion.  Edema: None  Mental Status: Normal mood and affect. Normal behavior. Normal judgment and thought content.   Assessment and Plan:  Pregnancy: G1P0 at [redacted]w[redacted]d 1. Supervision of normal first pregnancy, antepartum --Anticipatory guidance about next visits/weeks of pregnancy given. --No concerns or complaints, doing well --Complete genetic screening today with AFP - AFP, Serum, Open Spina Bifida  2. Trichomonal vaginitis during pregnancy in first trimester --TOC today - Cervicovaginal ancillary only( [place])  Preterm labor symptoms and general obstetric precautions including but not limited to vaginal bleeding, contractions, leaking of fluid and fetal movement were reviewed in detail with  the patient. Please refer to After Visit Summary for other counseling recommendations.   No follow-ups on file.  Future Appointments  Date Time Provider Department Center  10/12/2019  9:15 AM [place], Wilmer Floor, CNM [facility] None  11/06/2019 12:45 PM [facility] [place] 2 [facility]    Sharen Counter, CNM

## 2019-10-12 NOTE — Patient Instructions (Signed)

## 2019-10-12 NOTE — Progress Notes (Signed)
Patient presents for ROB. Patient has no concerns.  

## 2019-10-14 LAB — AFP, SERUM, OPEN SPINA BIFIDA
AFP MoM: 1.76
AFP Value: 58.9 ng/mL
Gest. Age on Collection Date: 15 weeks
Maternal Age At EDD: 22.4 yr
OSBR Risk 1 IN: 2814
Test Results:: NEGATIVE
Weight: 140 [lb_av]

## 2019-10-18 LAB — CERVICOVAGINAL ANCILLARY ONLY
Comment: NEGATIVE
Trichomonas: POSITIVE — AB

## 2019-10-20 ENCOUNTER — Telehealth: Payer: Self-pay | Admitting: Advanced Practice Midwife

## 2019-10-20 DIAGNOSIS — A5901 Trichomonal vulvovaginitis: Secondary | ICD-10-CM

## 2019-10-20 DIAGNOSIS — O23592 Infection of other part of genital tract in pregnancy, second trimester: Secondary | ICD-10-CM

## 2019-10-20 MED ORDER — METRONIDAZOLE 500 MG PO TABS: 500.0000 mg | ORAL_TABLET | Freq: Two times a day (BID) | ORAL | 0 refills | Status: AC

## 2019-10-20 NOTE — Telephone Encounter (Signed)
Called pt to inform her of positive TOC for trichomonas in pregnancy. Pt treated 09/14/19 for positive test. Pt reports she has not been sexually active since 1 month prior to her 09/14/19 exam.  Retreat with Flagyl 500 mg BID x 7 days. TOC in 3 weeks in the office. Pt states understanding.

## 2019-11-06 ENCOUNTER — Other Ambulatory Visit (HOSPITAL_COMMUNITY): Payer: Self-pay | Admitting: *Deleted

## 2019-11-06 ENCOUNTER — Ambulatory Visit (HOSPITAL_COMMUNITY)
Admission: RE | Admit: 2019-11-06 | Discharge: 2019-11-06 | Disposition: A | Discharge status: Home / Self Care | Payer: 59 | Source: Ambulatory Visit | Attending: Advanced Practice Midwife | Admitting: Advanced Practice Midwife

## 2019-11-06 ENCOUNTER — Other Ambulatory Visit: Payer: Self-pay

## 2019-11-06 DIAGNOSIS — Z3A19 19 weeks gestation of pregnancy: Secondary | ICD-10-CM

## 2019-11-06 DIAGNOSIS — O43192 Other malformation of placenta, second trimester: Secondary | ICD-10-CM

## 2019-11-06 DIAGNOSIS — Z363 Encounter for antenatal screening for malformations: Secondary | ICD-10-CM | POA: Diagnosis not present

## 2019-11-06 DIAGNOSIS — Z3400 Encounter for supervision of normal first pregnancy, unspecified trimester: Secondary | ICD-10-CM | POA: Insufficient documentation

## 2019-11-07 ENCOUNTER — Encounter: Payer: Self-pay | Admitting: Advanced Practice Midwife

## 2019-11-07 DIAGNOSIS — O43192 Other malformation of placenta, second trimester: Secondary | ICD-10-CM | POA: Insufficient documentation

## 2019-11-08 ENCOUNTER — Telehealth (INDEPENDENT_AMBULATORY_CARE_PROVIDER_SITE_OTHER): Payer: 59 | Admitting: Obstetrics

## 2019-11-08 ENCOUNTER — Encounter: Payer: Self-pay | Admitting: Obstetrics

## 2019-11-08 DIAGNOSIS — O98211 Gonorrhea complicating pregnancy, first trimester: Secondary | ICD-10-CM

## 2019-11-08 DIAGNOSIS — O0990 Supervision of high risk pregnancy, unspecified, unspecified trimester: Secondary | ICD-10-CM

## 2019-11-08 DIAGNOSIS — O43192 Other malformation of placenta, second trimester: Secondary | ICD-10-CM

## 2019-11-08 DIAGNOSIS — Z3A19 19 weeks gestation of pregnancy: Secondary | ICD-10-CM

## 2019-11-08 DIAGNOSIS — O98212 Gonorrhea complicating pregnancy, second trimester: Secondary | ICD-10-CM

## 2019-11-08 DIAGNOSIS — O23592 Infection of other part of genital tract in pregnancy, second trimester: Secondary | ICD-10-CM

## 2019-11-08 DIAGNOSIS — A5901 Trichomonal vulvovaginitis: Secondary | ICD-10-CM

## 2019-11-08 NOTE — Progress Notes (Signed)
OBSTETRICS PRENATAL VIRTUAL VISIT ENCOUNTER NOTE  Provider location: [facility] [place] [facility] [place]   I connected with Jillian Hess on 11/08/19 at  4:15 PM EDT by MyChart Video Encounter at home and verified that I am speaking with the correct person using two identifiers.   I discussed the limitations, risks, security and privacy concerns of performing an evaluation and management service virtually and the availability of in person appointments. I also discussed with the patient that there may be a patient responsible charge related to this service. The patient expressed understanding and agreed to proceed. Subjective:  Jillian Hess is a 24 y.o. G1P0 at [redacted]w[redacted]d being seen today for ongoing prenatal care.  She is currently monitored for the following issues for this high-risk pregnancy and has Gonorrhea affecting pregnancy in first trimester; Supervision of normal first pregnancy; Trichomonas infection; and Marginal insertion of umbilical cord affecting management of mother in second trimester on their problem list.  Patient reports no complaints.  Contractions: Not present. Vag. Bleeding: None.   . Denies any leaking of fluid.   The following portions of the patient's history were reviewed and updated as appropriate: allergies, current medications, past family history, past medical history, past social history, past surgical history and problem list.   Objective:  There were no vitals filed for this visit.  Fetal Status:           General:  Alert, oriented and cooperative. Patient is in no acute distress.  Respiratory: Normal respiratory effort, no problems with respiration noted  Mental Status: Normal mood and affect. Normal behavior. Normal judgment and thought content.  Rest of physical exam deferred due to type of encounter  Imaging: [place] MFM OB COMP + 14 WK  Result Date: 11/06/2019 ----------------------------------------------------------------------  OBSTETRICS REPORT                        (Signed Final 11/06/2019 03:14 pm) ---------------------------------------------------------------------- Patient Info  ID #:       [id number]                          D.O.B.:  [birth year] (24 yrs)  Name:       Jillian Hess                   Visit Date: 11/06/2019 12:49 pm ---------------------------------------------------------------------- Performed By  Performed By:     Hubert Azure          Referred By:      Irene Limbo  Attending:        Johnell Comings MD         [facility] ---------------------------------------------------------------------- Orders   #  Description  Code         Ordered By   1  [place] MFM OB COMP + 14 WK               [id number]     Clayton Bibles  ----------------------------------------------------------------------   #  Order #                    Accession #                 Episode #   1  [id number]                  [id number]                  [id number]  ---------------------------------------------------------------------- Indications   Encounter for antenatal screening for          Z36.3   malformations (neg AFP, low risk Nips, neg   horizon)   [redacted] weeks gestation of pregnancy                Z3A.19  ---------------------------------------------------------------------- Fetal Evaluation  Num Of Fetuses:         1  Fetal Heart Rate(bpm):  154  Cardiac Activity:       Observed  Presentation:           Cephalic  Placenta:               Anterior  P. Cord Insertion:      Marginal insertion  Amniotic Fluid  AFI FV:      Within normal limits                              Largest Pocket(cm)                              6.05 ---------------------------------------------------------------------- Biometry  BPD:      46.7  mm     G. Age:  20w 1d          73  %    CI:        74.83   %    70 - 86                                                          FL/HC:      17.5   %    16.8 - 19.8  HC:      171.3  mm     G. Age:  19w 5d         50  %    HC/AC:      1.10        1.09 - 1.39  AC:      155.7  mm     G. Age:  20w 5d         81  %  FL/BPD:     64.0   %  FL:       29.9  mm     G. Age:  19w 2d         30  %    FL/AC:      19.2   %    20 - 24  HUM:      28.6  mm     G. Age:  19w 2d         44  %  CER:      20.6  mm     G. Age:  19w 4d         51  %  NFT:       5.3  mm  LV:        6.9  mm  CM:          5  mm  Est. FW:     327  gm    0 lb 12 oz      72  % ---------------------------------------------------------------------- OB History  Gravidity:    1 ---------------------------------------------------------------------- Gestational Age  LMP:           24w 1d        Date:  05/21/19                 EDD:   02/25/20  U/S Today:     20w 0d                                        EDD:   03/25/20  Best:          19w 4d     Det. By:  Previous Ultrasound      EDD:   03/28/20                                      (09/04/19) ---------------------------------------------------------------------- Anatomy  Cranium:               Appears normal         LVOT:                   Appears normal  Cavum:                 Appears normal         Aortic Arch:            Appears normal  Ventricles:            Appears normal         Ductal Arch:            Appears normal  Choroid Plexus:        Appears normal         Diaphragm:              Appears normal  Cerebellum:            Appears normal         Stomach:                Appears normal, left  sided  Posterior Fossa:       Appears normal         Abdomen:                Appears normal  Nuchal Fold:           Appears normal         Abdominal Wall:         Appears nml (cord                                                                        insert, abd wall)  Face:                   Appears normal         Cord Vessels:           Appears normal (3                         (orbits and profile)                           vessel cord)  Lips:                  Appears normal         Kidneys:                Appear normal  Palate:                Appears normal         Bladder:                Appears normal  Thoracic:              Appears normal         Spine:                  Limited views                                                                        appear normal  Heart:                 Appears normal         Upper Extremities:      Appears normal                         (4CH, axis, and                         situs)  RVOT:                  Appears normal         Lower Extremities:      Appears normal  Other:  Heels and 5th digit visualized. Open hands visualized. Nasal bone  visualized. Fetus appears to be a female. ---------------------------------------------------------------------- Cervix Uterus Adnexa  Cervix  Length:           3.99  cm.  Normal appearance by transabdominal scan.  Uterus  No abnormality visualized.  Left Ovary  Within normal limits.  Right Ovary  Not visualized.  Adnexa  No abnormality visualized. ---------------------------------------------------------------------- Comments  This patient was seen for a detailed fetal anatomy scan.  She denies any significant past medical history and denies  any problems in her current pregnancy.  She had a cell free DNA test earlier in her pregnancy which  indicated a low risk for trisomy 73, 63, and 13. A female fetus is  predicted.  She was informed that the fetal growth and amniotic fluid  level were appropriate for her gestational age.  There were no obvious fetal anomalies noted on today's  ultrasound exam.  The patient was informed that anomalies may be missed due  to technical limitations. If the fetus is in a suboptimal position  or maternal habitus is increased, visualization of the fetus in  the maternal uterus may  be impaired.  A marginal placental cord insertion was noted today.  The  small association between a marginal placental cord insertion  and fetal growth issues later in her pregnancy was discussed.  Due to this indication, we will continue to follow her with serial  growth ultrasounds.  A follow-up exam was scheduled in 4 weeks to assess the  fetal growth and to obtain better views of the fetal spine. ----------------------------------------------------------------------                   Ma Rings, MD Electronically Signed Final Report   11/06/2019 03:14 pm ----------------------------------------------------------------------   Assessment and Plan:  Pregnancy: G1P0 at [redacted]w[redacted]d  1. Supervision of high risk pregnancy, antepartum  2. Gonorrhea affecting pregnancy in first trimester  3. Trichomonal vaginitis during pregnancy in second trimester  4. Marginal insertion of umbilical cord affecting management of mother in second trimester   Preterm labor symptoms and general obstetric precautions including but not limited to vaginal bleeding, contractions, leaking of fluid and fetal movement were reviewed in detail with the patient. I discussed the assessment and treatment plan with the patient. The patient was provided an opportunity to ask questions and all were answered. The patient agreed with the plan and demonstrated an understanding of the instructions. The patient was advised to call back or seek an in-person office evaluation/go to MAU at [facility] for any urgent or concerning symptoms. Please refer to After Visit Summary for other counseling recommendations.   I provided 10 minutes of face-to-face time during this encounter.  Return in about 4 weeks (around 12/06/2019) for MyChart [facility]Faculty Only.  Future Appointments  Date Time Provider Department Center  12/04/2019  1:45 PM [facility] NURSE [facility]  12/04/2019  1:45 PM [facility] [place] 5 [facility]-M[facility]US [facility]-US    Coral Ceo,  MD [facility], [facility] 11/08/2019

## 2019-11-08 NOTE — Progress Notes (Signed)
Pt is on the phone preparing for virtual visit with provider. [redacted]w[redacted]d. Pt reports BP cuff is too big, advised pt go to [employer] and switch it out, pt voices understanding. Pt denies any HA, blurry vision, swelling or sharp abdominal pain.

## 2019-12-04 ENCOUNTER — Ambulatory Visit (HOSPITAL_COMMUNITY): Payer: 59 | Attending: Obstetrics and Gynecology

## 2019-12-04 ENCOUNTER — Other Ambulatory Visit: Payer: Self-pay | Admitting: *Deleted

## 2019-12-04 ENCOUNTER — Ambulatory Visit: Payer: 59 | Admitting: *Deleted

## 2019-12-04 ENCOUNTER — Other Ambulatory Visit: Payer: Self-pay

## 2019-12-04 VITALS — BP 112/76 | HR 82 | Temp 97.4°F

## 2019-12-04 DIAGNOSIS — O43199 Other malformation of placenta, unspecified trimester: Secondary | ICD-10-CM

## 2019-12-04 DIAGNOSIS — O43192 Other malformation of placenta, second trimester: Secondary | ICD-10-CM

## 2019-12-04 DIAGNOSIS — Z362 Encounter for other antenatal screening follow-up: Secondary | ICD-10-CM | POA: Insufficient documentation

## 2019-12-04 DIAGNOSIS — Z3A23 23 weeks gestation of pregnancy: Secondary | ICD-10-CM | POA: Insufficient documentation

## 2019-12-06 ENCOUNTER — Telehealth (INDEPENDENT_AMBULATORY_CARE_PROVIDER_SITE_OTHER): Payer: 59 | Admitting: Obstetrics and Gynecology

## 2019-12-06 ENCOUNTER — Encounter: Payer: Self-pay | Admitting: Obstetrics and Gynecology

## 2019-12-06 NOTE — Progress Notes (Signed)
Patient unavailable for virtual visit. Several messages left

## 2019-12-12 ENCOUNTER — Encounter: Payer: Self-pay | Admitting: Obstetrics

## 2019-12-12 ENCOUNTER — Telehealth (INDEPENDENT_AMBULATORY_CARE_PROVIDER_SITE_OTHER): Payer: 59 | Admitting: Obstetrics

## 2019-12-12 DIAGNOSIS — Z3400 Encounter for supervision of normal first pregnancy, unspecified trimester: Secondary | ICD-10-CM

## 2019-12-12 DIAGNOSIS — O43192 Other malformation of placenta, second trimester: Secondary | ICD-10-CM

## 2019-12-12 DIAGNOSIS — M549 Dorsalgia, unspecified: Secondary | ICD-10-CM

## 2019-12-12 DIAGNOSIS — Z3A24 24 weeks gestation of pregnancy: Secondary | ICD-10-CM

## 2019-12-12 DIAGNOSIS — A599 Trichomoniasis, unspecified: Secondary | ICD-10-CM

## 2019-12-12 MED ORDER — COMFORT FIT MATERNITY SUPP SM MISC: 0 refills | Status: AC

## 2019-12-12 NOTE — Progress Notes (Signed)
Virtual Visit via Telephone Note  I connected with Jillian Hess on 12/12/19 at  4:15 PM EDT by telephone and verified that I am speaking with the correct person using two identifiers.  Pt having issues with BP cuff; pt will bring cuff to next visit Needs TOC Trich - unsure if partner was treated

## 2019-12-12 NOTE — Progress Notes (Signed)
OBSTETRICS PRENATAL VIRTUAL VISIT ENCOUNTER NOTE  Provider location: [facility] at [place]   I connected with Jillian Hess on 12/12/19 at  4:15 PM EDT by MyChart Video Encounter at home and verified that I am speaking with the correct person using two identifiers.   I discussed the limitations, risks, security and privacy concerns of performing an evaluation and management service virtually and the availability of in person appointments. I also discussed with the patient that there may be a patient responsible charge related to this service. The patient expressed understanding and agreed to proceed. Subjective:  Jillian Hess is a 24 y.o. G1P0 at [redacted]w[redacted]d being seen today for ongoing prenatal care.  She is currently monitored for the following issues for this high-risk pregnancy and has Gonorrhea affecting pregnancy in first trimester; Supervision of normal first pregnancy; Trichomonas infection; and Marginal insertion of umbilical cord affecting management of mother in second trimester on their problem list.  Patient reports backache.  Contractions: Not present. Vag. Bleeding: None.  Movement: Present. Denies any leaking of fluid.   The following portions of the patient's history were reviewed and updated as appropriate: allergies, current medications, past family history, past medical history, past social history, past surgical history and problem list.   Objective:  There were no vitals filed for this visit.  Fetal Status:     Movement: Present     General:  Alert, oriented and cooperative. Patient is in no acute distress.  Respiratory: Normal respiratory effort, no problems with respiration noted  Mental Status: Normal mood and affect. Normal behavior. Normal judgment and thought content.  Rest of physical exam deferred due to type of encounter  Imaging: [place] MFM OB FOLLOW UP  Result Date:  12/04/2019 ----------------------------------------------------------------------  OBSTETRICS REPORT                    (Corrected Final 12/04/2019 03:11 pm) ---------------------------------------------------------------------- Patient Info  ID #:       [id number]                          D.O.B.:  Jul 30, 1996 (24 yrs)  Name:       Jillian Hess                   Visit Date: 12/04/2019 02:06 pm ---------------------------------------------------------------------- Performed By  Attending:        Noralee Space MD        Referred By:      Calvert Cantor  Performed By:     Tommie Raymond BS,       [facility]                    RDMS, RVT                                Fetal Care ---------------------------------------------------------------------- Orders  #  Description  Code        Ordered By  1  [place] MFM OB FOLLOW UP                   [id number]    Rosana Hoes ----------------------------------------------------------------------  #  Order #                     Accession #                Episode #  1  [id number]                   [id number]                 [id number] ---------------------------------------------------------------------- Indications  [redacted] weeks gestation of pregnancy                Z3A.23  Encounter for other antenatal screening        Z36.2  follow-up  Antenatal follow-up for nonvisualized fetal    Z36.2  anatomy ---------------------------------------------------------------------- Fetal Evaluation  Num Of Fetuses:         1  Fetal Heart Rate(bpm):  155  Cardiac Activity:       Observed  Presentation:           Cephalic  Placenta:               Anterior  P. Cord Insertion:      Marginal insertion prev.  Amniotic Fluid  AFI FV:      Within normal limits                              Largest Pocket(cm)                              5.1 ----------------------------------------------------------------------  Biometry  BPD:      58.3  mm     G. Age:  23w 6d         56  %    CI:        75.92   %    70 - 86                                                          FL/HC:      19.0   %    18.7 - 20.9  HC:      212.1  mm     G. Age:  23w 2d         23  %    HC/AC:      1.12        1.05 - 1.21  AC:      189.1  mm     G. Age:  23w 5d         44  %    FL/BPD:     69.3   %    71 - 87  FL:       40.4  mm     G. Age:  23w 0d         23  %    FL/AC:      21.4   %  20 - 24  HUM:      37.2  mm     G. Age:  23w 0d         30  %  CER:      26.5  mm     G. Age:  24w 2d         62  %  LV:        5.8  mm  CM:        7.5  mm  Est. FW:     592  gm      1 lb 5 oz     34  % ---------------------------------------------------------------------- OB History  Gravidity:    1 ---------------------------------------------------------------------- Gestational Age  LMP:           28w 1d        Date:  05/21/19                 EDD:   02/25/20  U/S Today:     23w 3d                                        EDD:   03/29/20  Best:          23w 4d     Det. By:  Previous Ultrasound      EDD:   03/28/20                                      (09/04/19) ---------------------------------------------------------------------- Anatomy  Cranium:               Appears normal         LVOT:                   Appears normal  Cavum:                 Previously seen        Aortic Arch:            Appears normal  Ventricles:            Appears normal         Ductal Arch:            Appears normal  Choroid Plexus:        Appears normal         Diaphragm:              Appears normal  Cerebellum:            Appears normal         Stomach:                Appears normal, left                                                                        sided  Posterior Fossa:       Previously seen        Abdomen:  Appears normal  Nuchal Fold:           Previously seen        Abdominal Wall:         Previously seen  Face:                  Orbits and profile     Cord  Vessels:           Previously seen                         previously seen  Lips:                  Previously seen        Kidneys:                Appear normal  Palate:                Previously seen        Bladder:                Appears normal  Thoracic:              Previously seen        Spine:                  Appears normal  Heart:                 Appears normal         Upper Extremities:      Previously seen                         (4CH, axis, and                         situs)  RVOT:                  Appears normal         Lower Extremities:      Previously seen  Other:  Heels and 5th digit visualized prev. Open hands visualized prev.          Nasal bone visualized prev. Female gender previously seen. ---------------------------------------------------------------------- Cervix Uterus Adnexa  Cervix  Not adaquately visualized  Uterus  No abnormality visualized.  Right Ovary  Within normal limits. No adnexal mass visualized.  Left Ovary  Within normal limits. No adnexal mass visualized.  Cul De Sac  No free fluid seen.  Adnexa  No abnormality visualized. ---------------------------------------------------------------------- Impression  Patient returned for completion of fetal anatomy .Fetal  biometry is consistent with her previously-established dates  .Amniotic fluid is normal and good fetal activity is seen .Fetal  anatomical survey (spine) was completed and appears  normal. Marginal cord insertion is seen again. ---------------------------------------------------------------------- Recommendations  -An appointment was made for her to return in 5 weeks for  fetal growth assessment. ed. ----------------------------------------------------------------------                       Noralee Spaceavi Shankar, MD Electronically Signed Corrected Final Report  12/04/2019 03:11 pm ----------------------------------------------------------------------   Assessment and Plan:  Pregnancy: G1P0 at 7539w5d 1. Supervision of normal  first pregnancy, antepartum  2. Marginal insertion of umbilical cord affecting management of mother in second trimester - followed by MFM  3. Trichomonas infection Rx: - Cervicovaginal ancillary only  4. Backache symptom Rx: -  Elastic Bandages & Supports (COMFORT FIT MATERNITY SUPP SM) MISC; Wear as directed.  Dispense: 1 each; Refill: 0   Preterm labor symptoms and general obstetric precautions including but not limited to vaginal bleeding, contractions, leaking of fluid and fetal movement were reviewed in detail with the patient. I discussed the assessment and treatment plan with the patient. The patient was provided an opportunity to ask questions and all were answered. The patient agreed with the plan and demonstrated an understanding of the instructions. The patient was advised to call back or seek an in-person office evaluation/go to MAU at [facility] for any urgent or concerning symptoms. Please refer to After Visit Summary for other counseling recommendations.   I provided 10 minutes of face-to-face time during this encounter.  Return in about 4 weeks (around 01/09/2020) for ROB, 2 hour OGTT.  Future Appointments  Date Time Provider [place]  12/12/2019  4:15 PM Shelly Bombard, MD [facility] None  01/04/2020  8:15 AM [facility] LAB [facility] None  01/04/2020  8:30 AM Shelly Bombard, MD [place] None  01/09/2020 10:45 AM [facility] NURSE [facility]  01/09/2020 10:45 AM [facility] US4 [facility] [place], [place] for [facility] [place] [facility] 12/12/2019

## 2020-01-04 ENCOUNTER — Other Ambulatory Visit (HOSPITAL_COMMUNITY)
Admission: RE | Admit: 2020-01-04 | Discharge: 2020-01-04 | Disposition: A | Discharge status: Home / Self Care | Payer: 59 | Source: Ambulatory Visit | Attending: Obstetrics | Admitting: Obstetrics

## 2020-01-04 ENCOUNTER — Other Ambulatory Visit: Payer: Self-pay

## 2020-01-04 ENCOUNTER — Ambulatory Visit (INDEPENDENT_AMBULATORY_CARE_PROVIDER_SITE_OTHER): Payer: 59 | Admitting: Obstetrics

## 2020-01-04 ENCOUNTER — Encounter: Payer: Self-pay | Admitting: Obstetrics

## 2020-01-04 ENCOUNTER — Other Ambulatory Visit: Payer: 59

## 2020-01-04 VITALS — BP 111/74 | HR 82 | Wt 152.0 lb

## 2020-01-04 DIAGNOSIS — O98213 Gonorrhea complicating pregnancy, third trimester: Secondary | ICD-10-CM | POA: Diagnosis not present

## 2020-01-04 DIAGNOSIS — Z3400 Encounter for supervision of normal first pregnancy, unspecified trimester: Secondary | ICD-10-CM

## 2020-01-04 DIAGNOSIS — O43192 Other malformation of placenta, second trimester: Secondary | ICD-10-CM | POA: Diagnosis not present

## 2020-01-04 DIAGNOSIS — Z3A28 28 weeks gestation of pregnancy: Secondary | ICD-10-CM

## 2020-01-04 DIAGNOSIS — B75 Trichinellosis: Secondary | ICD-10-CM | POA: Diagnosis present

## 2020-01-04 DIAGNOSIS — A599 Trichomoniasis, unspecified: Secondary | ICD-10-CM | POA: Diagnosis not present

## 2020-01-04 DIAGNOSIS — O0990 Supervision of high risk pregnancy, unspecified, unspecified trimester: Secondary | ICD-10-CM

## 2020-01-04 DIAGNOSIS — O98211 Gonorrhea complicating pregnancy, first trimester: Secondary | ICD-10-CM

## 2020-01-04 NOTE — Progress Notes (Signed)
Subjective:  Jillian Hess is a 24 y.o. G1P0 at [redacted]w[redacted]d being seen today for ongoing prenatal care.  She is currently monitored for the following issues for this high-risk pregnancy and has Gonorrhea affecting pregnancy in first trimester; Supervision of normal first pregnancy; Trichomonas infection; and Marginal insertion of umbilical cord affecting management of mother in second trimester on their problem list.  Patient reports no complaints.  Contractions: Irritability. Vag. Bleeding: None.  Movement: Present. Denies leaking of fluid.   The following portions of the patient's history were reviewed and updated as appropriate: allergies, current medications, past family history, past medical history, past social history, past surgical history and problem list. Problem list updated.  Objective:   Vitals:   01/04/20 0836  BP: 111/74  Pulse: 82  Weight: 152 lb (68.9 kg)    Fetal Status:     Movement: Present     General:  Alert, oriented and cooperative. Patient is in no acute distress.  Skin: Skin is warm and dry. No rash noted.   Cardiovascular: Normal heart rate noted  Respiratory: Normal respiratory effort, no problems with respiration noted  Abdomen: Soft, gravid, appropriate for gestational age. Pain/Pressure: Present     Pelvic:  Cervical exam deferred        Extremities: Normal range of motion.     Mental Status: Normal mood and affect. Normal behavior. Normal judgment and thought content.   Urinalysis:      Assessment and Plan:  Pregnancy: G1P0 at [redacted]w[redacted]d  1. Supervision of high risk pregnancy, antepartum Rx: - Glucose Tolerance, 2 Hours w/1 Hour - CBC - HIV Antibody (routine testing w rflx) - RPR  2. Marginal insertion of umbilical cord affecting management of mother in second trimester - ultrasound follow up by MFM  3. Trichomonas infection Rx: - Cervicovaginal ancillary only( [place]) - TOC today  4. Gonorrhea affecting pregnancy in first trimester, treated -  TOC today   Preterm labor symptoms and general obstetric precautions including but not limited to vaginal bleeding, contractions, leaking of fluid and fetal movement were reviewed in detail with the patient. Please refer to After Visit Summary for other counseling recommendations.   Return in about 2 weeks (around 01/18/2020) for MyChart.   Brock Bad, MD  01/04/20

## 2020-01-04 NOTE — Progress Notes (Signed)
Pt states pelvic pressure and period like cramps.

## 2020-01-05 ENCOUNTER — Other Ambulatory Visit: Payer: Self-pay | Admitting: Obstetrics

## 2020-01-05 DIAGNOSIS — D696 Thrombocytopenia, unspecified: Secondary | ICD-10-CM

## 2020-01-05 LAB — GLUCOSE TOLERANCE, 2 HOURS W/ 1HR
Glucose, 1 hour: 109 mg/dL (ref 65–179)
Glucose, 2 hour: 88 mg/dL (ref 65–152)
Glucose, Fasting: 70 mg/dL (ref 65–91)

## 2020-01-05 LAB — CBC
Hematocrit: 34.8 % (ref 34.0–46.6)
Hemoglobin: 11.5 g/dL (ref 11.1–15.9)
MCH: 31.7 pg (ref 26.6–33.0)
MCHC: 33 g/dL (ref 31.5–35.7)
MCV: 96 fL (ref 79–97)
Platelets: 145 10*3/uL — ABNORMAL LOW (ref 150–450)
RBC: 3.63 x10E6/uL — ABNORMAL LOW (ref 3.77–5.28)
RDW: 12.9 % (ref 11.7–15.4)
WBC: 8.1 10*3/uL (ref 3.4–10.8)

## 2020-01-05 LAB — CERVICOVAGINAL ANCILLARY ONLY
Bacterial Vaginitis (gardnerella): NEGATIVE
Candida Glabrata: NEGATIVE
Candida Vaginitis: POSITIVE — AB
Chlamydia: NEGATIVE
Comment: NEGATIVE
Comment: NEGATIVE
Comment: NEGATIVE
Comment: NEGATIVE
Comment: NEGATIVE
Comment: NORMAL
Neisseria Gonorrhea: NEGATIVE
Trichomonas: NEGATIVE

## 2020-01-05 LAB — RPR: RPR Ser Ql: NONREACTIVE

## 2020-01-05 LAB — HIV ANTIBODY (ROUTINE TESTING W REFLEX): HIV Screen 4th Generation wRfx: NONREACTIVE

## 2020-01-06 ENCOUNTER — Other Ambulatory Visit: Payer: Self-pay | Admitting: Obstetrics

## 2020-01-06 DIAGNOSIS — B3731 Acute candidiasis of vulva and vagina: Secondary | ICD-10-CM

## 2020-01-06 MED ORDER — TERCONAZOLE 0.4 % VA CREA: 1.0000 | TOPICAL_CREAM | Freq: Every day | VAGINAL | 0 refills | Status: AC

## 2020-01-09 ENCOUNTER — Ambulatory Visit: Payer: 59 | Attending: Obstetrics and Gynecology

## 2020-01-09 ENCOUNTER — Other Ambulatory Visit: Payer: Self-pay

## 2020-01-09 ENCOUNTER — Ambulatory Visit: Payer: 59 | Admitting: *Deleted

## 2020-01-09 ENCOUNTER — Other Ambulatory Visit: Payer: Self-pay | Admitting: *Deleted

## 2020-01-09 VITALS — BP 114/77 | HR 95

## 2020-01-09 DIAGNOSIS — Z362 Encounter for other antenatal screening follow-up: Secondary | ICD-10-CM

## 2020-01-09 DIAGNOSIS — O43199 Other malformation of placenta, unspecified trimester: Secondary | ICD-10-CM | POA: Insufficient documentation

## 2020-01-09 DIAGNOSIS — O43193 Other malformation of placenta, third trimester: Secondary | ICD-10-CM

## 2020-01-09 DIAGNOSIS — O99119 Other diseases of the blood and blood-forming organs and certain disorders involving the immune mechanism complicating pregnancy, unspecified trimester: Secondary | ICD-10-CM | POA: Diagnosis not present

## 2020-01-09 DIAGNOSIS — Z3A28 28 weeks gestation of pregnancy: Secondary | ICD-10-CM

## 2020-01-09 DIAGNOSIS — D696 Thrombocytopenia, unspecified: Secondary | ICD-10-CM

## 2020-01-18 ENCOUNTER — Encounter: Payer: 59 | Admitting: Women's Health

## 2020-01-29 ENCOUNTER — Encounter: Payer: 59 | Admitting: Obstetrics and Gynecology

## 2020-02-08 ENCOUNTER — Other Ambulatory Visit: Payer: Self-pay | Admitting: *Deleted

## 2020-02-08 ENCOUNTER — Ambulatory Visit: Payer: 59 | Admitting: *Deleted

## 2020-02-08 ENCOUNTER — Other Ambulatory Visit: Payer: Self-pay

## 2020-02-08 ENCOUNTER — Ambulatory Visit: Payer: 59 | Attending: Obstetrics and Gynecology

## 2020-02-08 VITALS — BP 120/81 | HR 95

## 2020-02-08 DIAGNOSIS — O0990 Supervision of high risk pregnancy, unspecified, unspecified trimester: Secondary | ICD-10-CM | POA: Diagnosis present

## 2020-02-08 DIAGNOSIS — D696 Thrombocytopenia, unspecified: Secondary | ICD-10-CM

## 2020-02-08 DIAGNOSIS — Z3A33 33 weeks gestation of pregnancy: Secondary | ICD-10-CM

## 2020-02-08 DIAGNOSIS — Z362 Encounter for other antenatal screening follow-up: Secondary | ICD-10-CM

## 2020-02-08 DIAGNOSIS — O99119 Other diseases of the blood and blood-forming organs and certain disorders involving the immune mechanism complicating pregnancy, unspecified trimester: Secondary | ICD-10-CM

## 2020-02-08 DIAGNOSIS — O43193 Other malformation of placenta, third trimester: Secondary | ICD-10-CM | POA: Diagnosis not present

## 2020-03-07 ENCOUNTER — Ambulatory Visit: Payer: 59

## 2022-01-28 IMAGING — US US MFM OB COMP +14 WKS
2 series · 13 of 28 positions shown · non-contrast
Comparison: none

[Series 1: us mfm ob comp +14 wks · 120 acquisitions, 11 frames shown (1 of 2)]
[im 6/120]
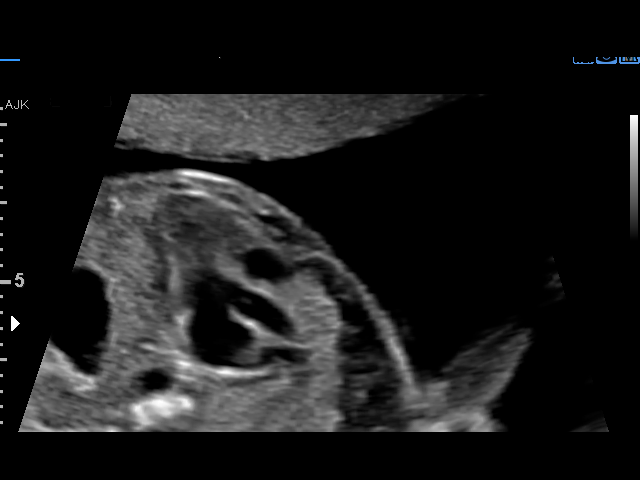
[im 16/120]
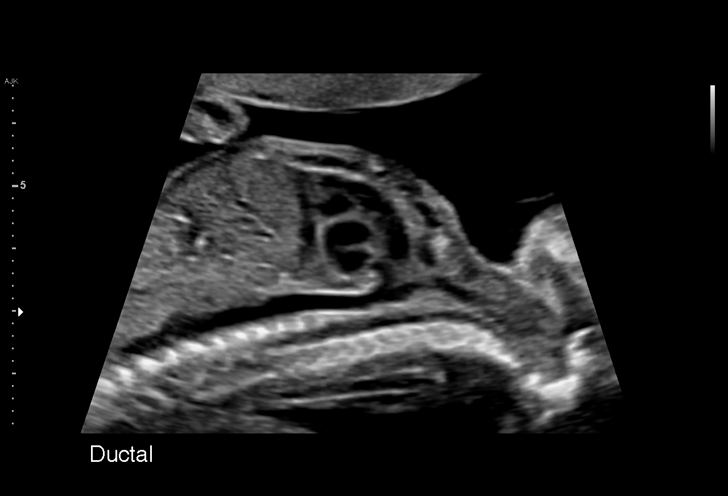
[im 26/120]
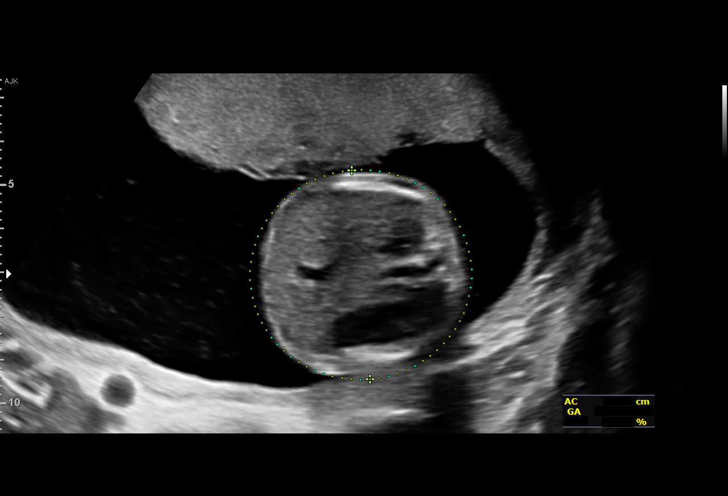
[im 37/120]
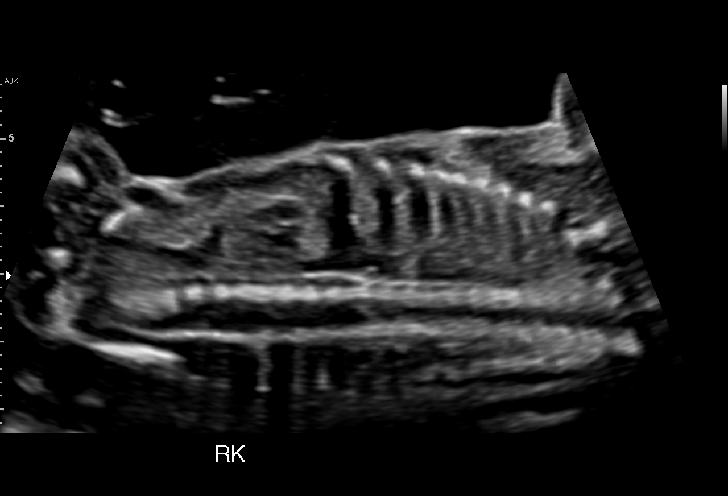
[im 47/120]
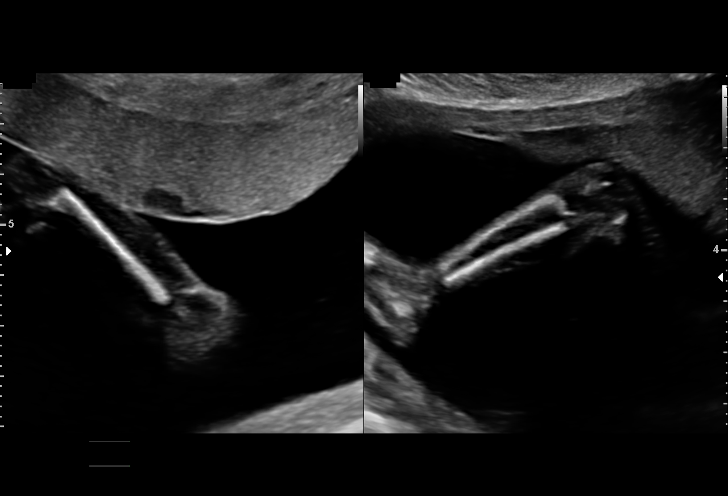
[im 57/120]
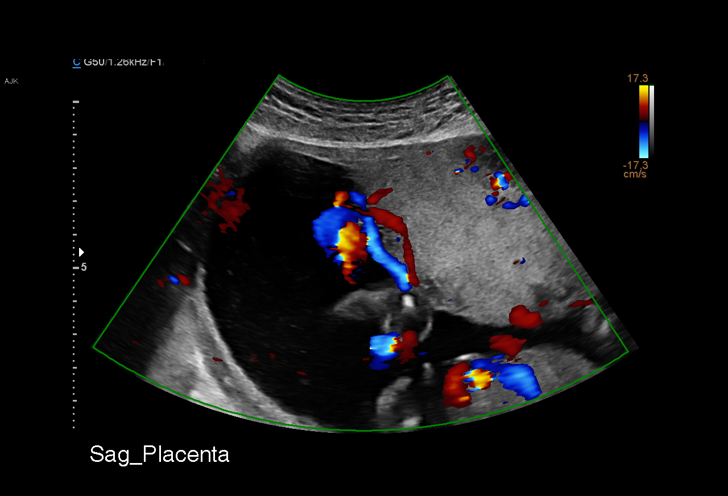
[im 73/120]
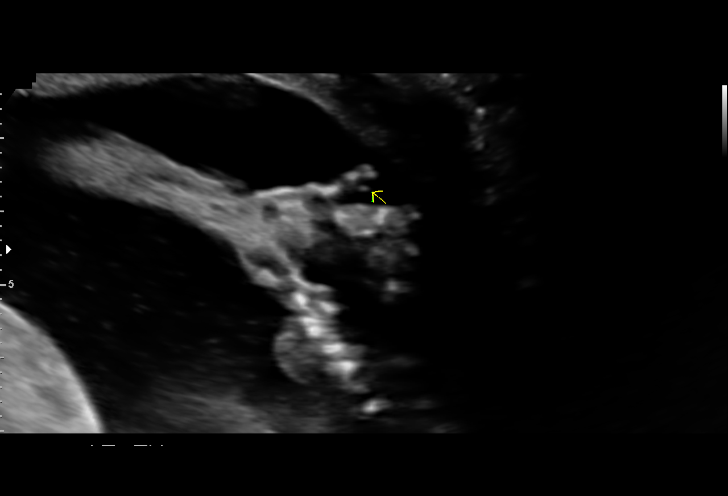
[im 83/120]
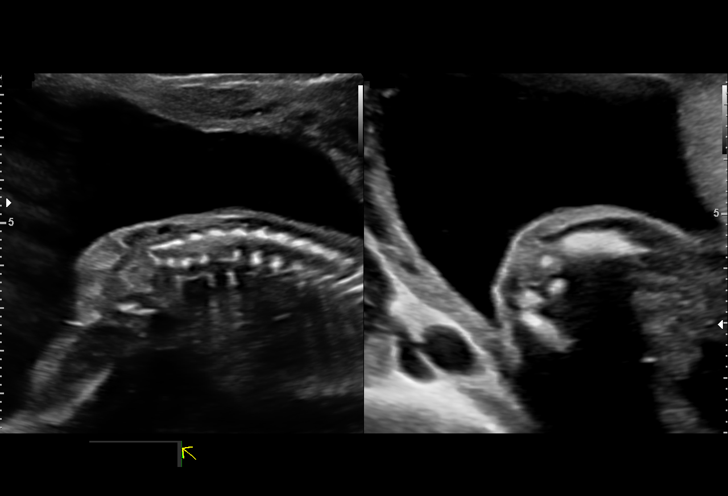
[im 94/120]
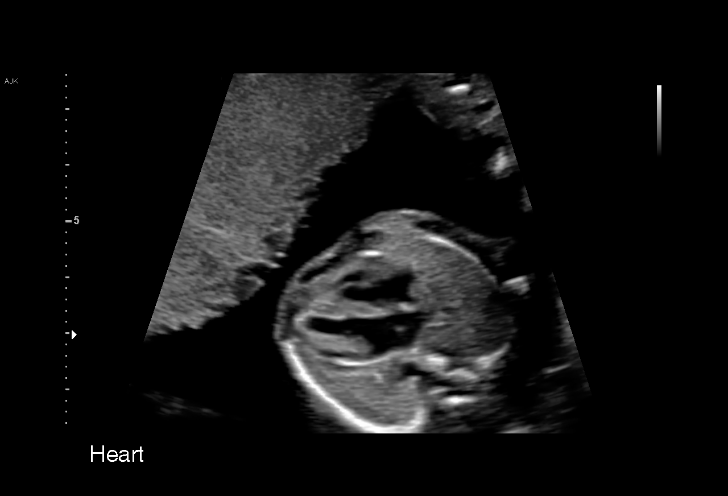
[im 104/120]
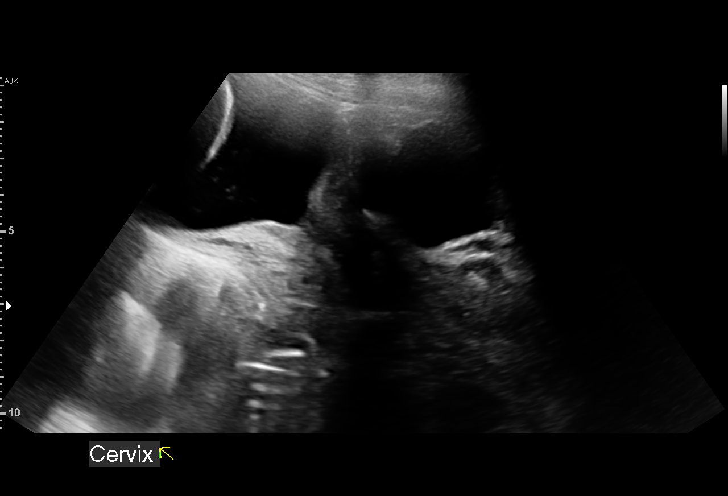
[im 114/120]
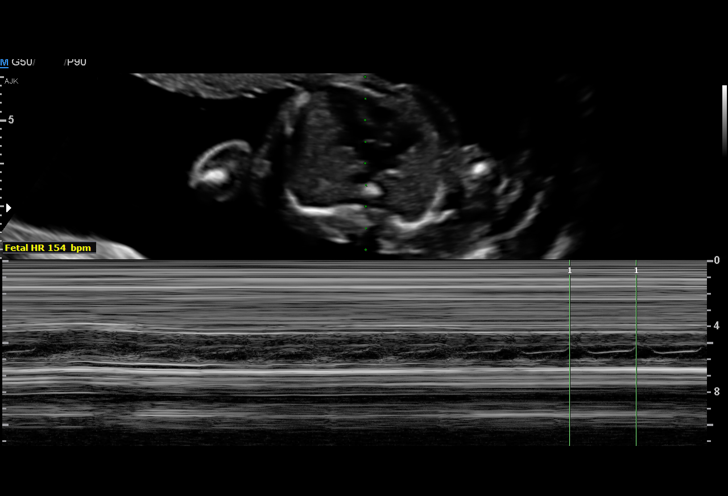

[Series 3: us mfm ob comp +14 wks · 2 of 18 slices shown (2 of 2)]
[im 1/18]
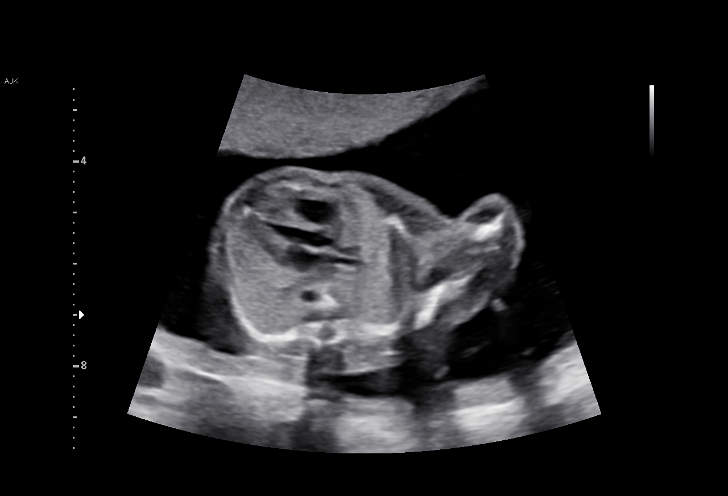
[im 12/18]
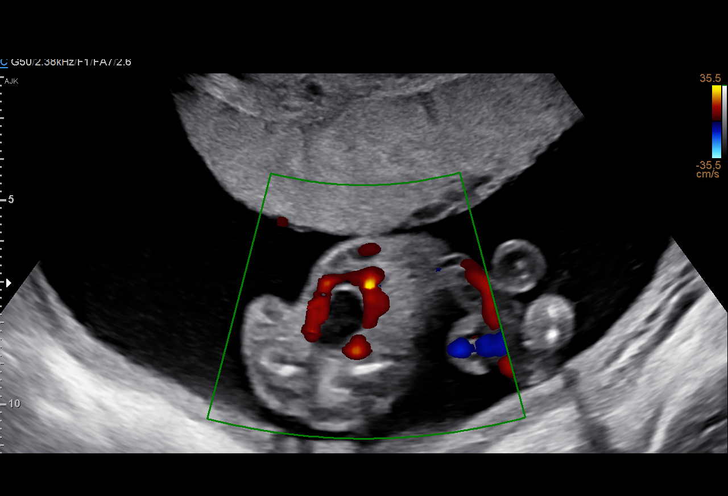

[13 of 28 positions shown; findings below may reference images not displayed]

SELVI
 ----------------------------------------------------------------------

 ----------------------------------------------------------------------
Indications

  Encounter for antenatal screening for
  malformations (neg AFP, low risk Nips, neg
  horizon)
  19 weeks gestation of pregnancy
 ----------------------------------------------------------------------
Fetal Evaluation

 Num Of Fetuses:         1
 Fetal Heart Rate(bpm):  154
 Cardiac Activity:       Observed
 Presentation:           Cephalic
 Placenta:               Anterior
 P. Cord Insertion:      Marginal insertion

 Amniotic Fluid
 AFI FV:      Within normal limits

                             Largest Pocket(cm)

Biometry

 BPD:      46.7  mm     G. Age:  20w 1d         73  %    CI:        74.83   %    70 - 86
                                                         FL/HC:      17.5   %    16.8 -
 HC:      171.3  mm     G. Age:  19w 5d         50  %    HC/AC:      1.10        1.09 -
 AC:      155.7  mm     G. Age:  20w 5d         81  %    FL/BPD:     64.0   %
 FL:       29.9  mm     G. Age:  19w 2d         30  %    FL/AC:      19.2   %    20 - 24
 HUM:      28.6  mm     G. Age:  19w 2d         44  %
 CER:      20.6  mm     G. Age:  19w 4d         51  %
 NFT:       5.3  mm

 LV:        6.9  mm
 CM:          5  mm

 Est. FW:     327  gm    0 lb 12 oz      72  %
OB History

 Gravidity:    1
Gestational Age

 LMP:           24w 1d        Date:  05/21/19                 EDD:   02/25/20
 U/S Today:     20w 0d                                        EDD:   03/25/20
 Best:          19w 4d     Det. By:  Previous Ultrasound      EDD:   03/28/20
                                     (09/04/19)
Anatomy

 Cranium:               Appears normal         LVOT:                   Appears normal
 Cavum:                 Appears normal         Aortic Arch:            Appears normal
 Ventricles:            Appears normal         Ductal Arch:            Appears normal
 Choroid Plexus:        Appears normal         Diaphragm:              Appears normal
 Cerebellum:            Appears normal         Stomach:                Appears normal, left
                                                                       sided
 Posterior Fossa:       Appears normal         Abdomen:                Appears normal
 Nuchal Fold:           Appears normal         Abdominal Wall:         Appears nml (cord
                                                                       insert, abd wall)
 Face:                  Appears normal         Cord Vessels:           Appears normal (3
                        (orbits and profile)                           vessel cord)
 Lips:                  Appears normal         Kidneys:                Appear normal
 Palate:                Appears normal         Bladder:                Appears normal
 Thoracic:              Appears normal         Spine:                  Limited views
                                                                       appear normal
 Heart:                 Appears normal         Upper Extremities:      Appears normal
                        (4CH, axis, and
                        situs)
 RVOT:                  Appears normal         Lower Extremities:      Appears normal

 Other:  Heels and 5th digit visualized. Open hands visualized. Nasal bone
         visualized. Fetus appears to be a male.
Cervix Uterus Adnexa

 Cervix
 Length:           3.99  cm.
 Normal appearance by transabdominal scan.

 Uterus
 No abnormality visualized.

 Left Ovary
 Within normal limits.

 Right Ovary
 Not visualized.

 Adnexa
 No abnormality visualized.
Comments

 This patient was seen for a detailed fetal anatomy scan.
 She denies any significant past medical history and denies
 any problems in her current pregnancy.
 She had a cell free DNA test earlier in her pregnancy which
 indicated a low risk for trisomy 21, 18, and 13. A male fetus is
 predicted.
 She was informed that the fetal growth and amniotic fluid
 level were appropriate for her gestational age.
 There were no obvious fetal anomalies noted on today's
 ultrasound exam.
 The patient was informed that anomalies may be missed due
 to technical limitations. If the fetus is in a suboptimal position
 or maternal habitus is increased, visualization of the fetus in
 the maternal uterus may be impaired.
 A marginal placental cord insertion was noted today.  The
 small association between a marginal placental cord insertion
 and fetal growth issues later in her pregnancy was discussed.
 Due to this indication, we will continue to follow her with serial
 growth ultrasounds.
 A follow-up exam was scheduled in 4 weeks to assess the
 fetal growth and to obtain better views of the fetal spine.

## 2022-02-25 IMAGING — US US MFM OB FOLLOW-UP
1 series · 13 of 28 positions shown · non-contrast
Comparison: none

[Series 1: us mfm ob follow-up · 13 of 67 slices shown]
[im 3/67]
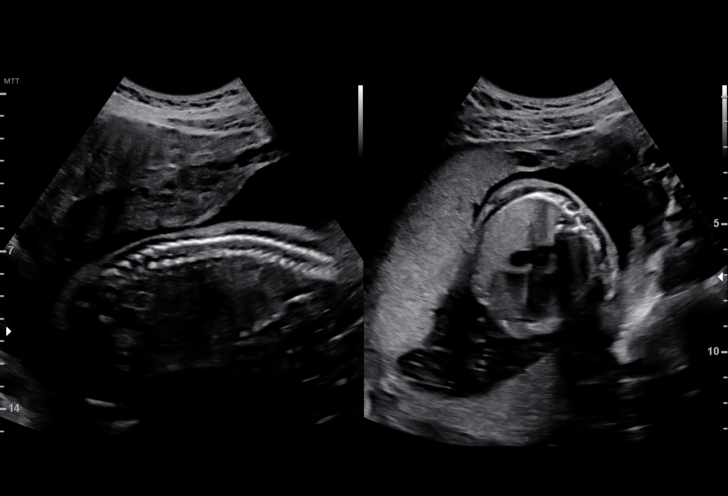
[im 8/67]
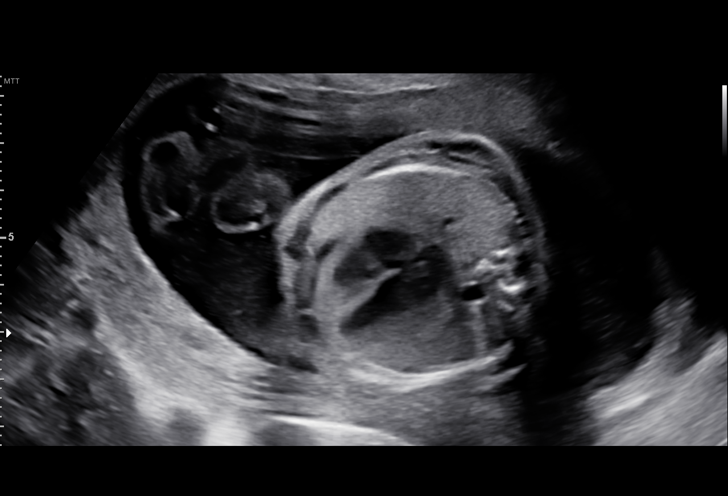
[im 13/67]
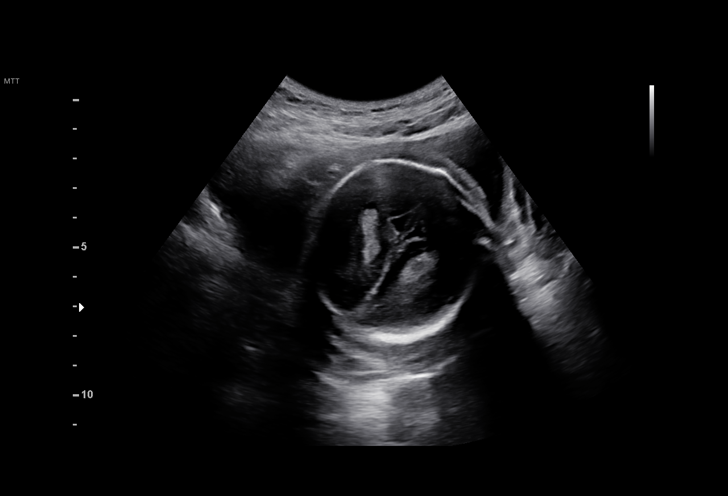
[im 18/67]
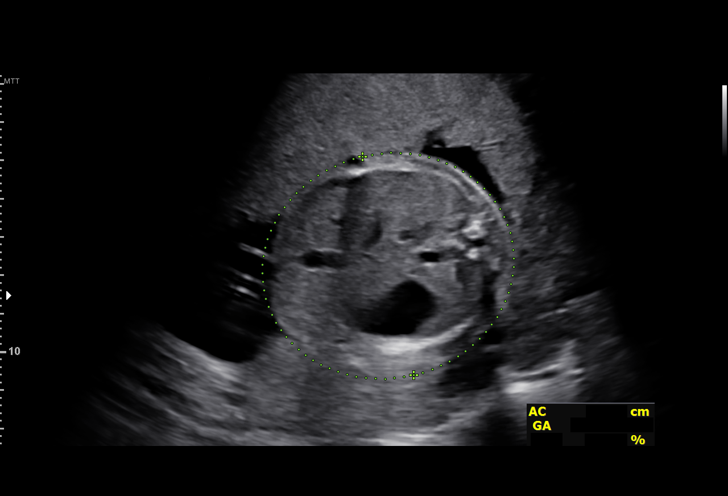
[im 23/67]
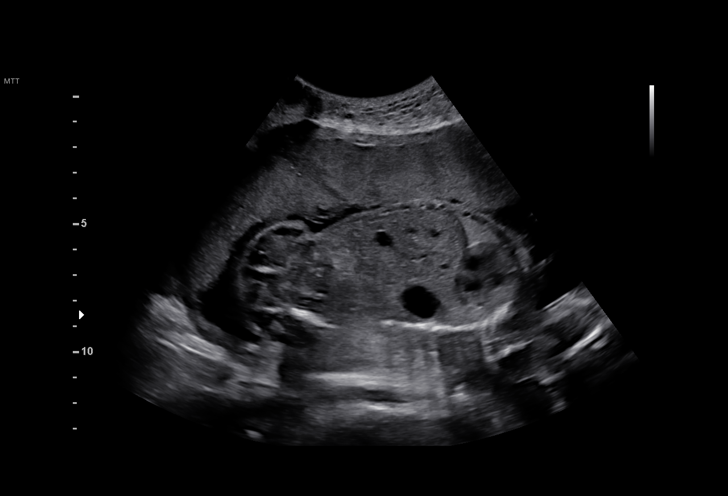
[im 27/67]
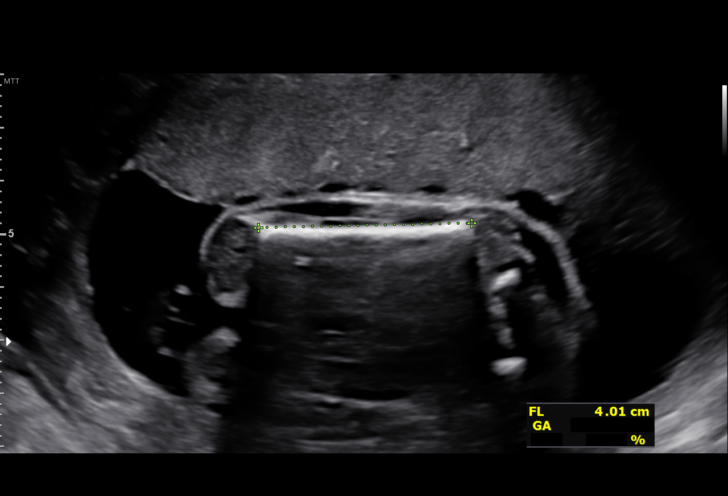
[im 35/67]
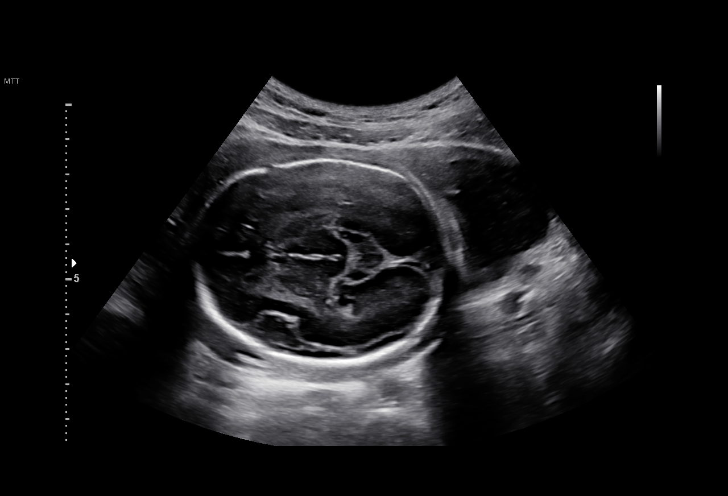
[im 40/67]
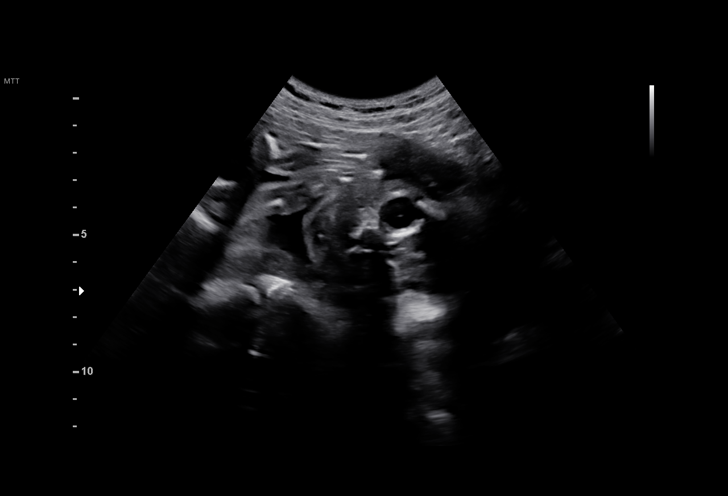
[im 45/67]
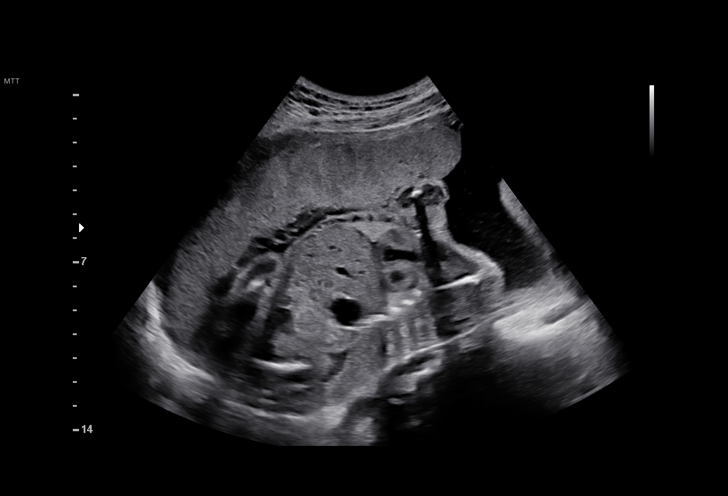
[im 49/67]
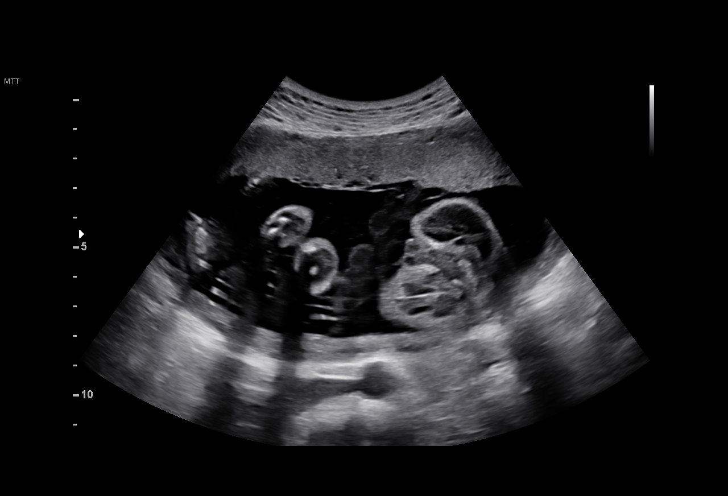
[im 54/67]
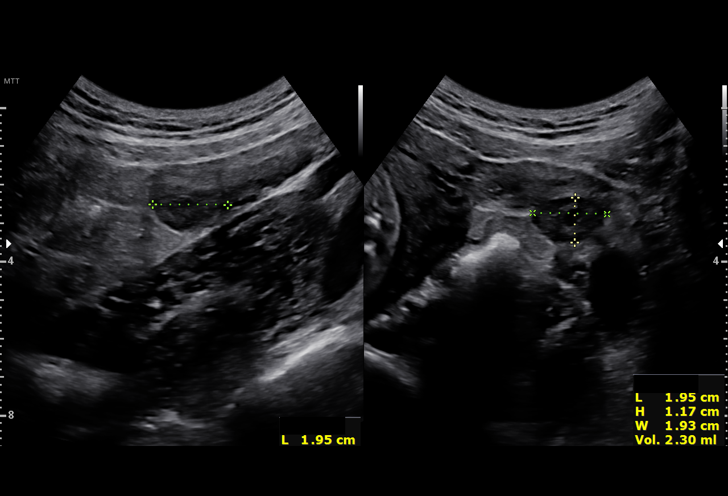
[im 59/67]
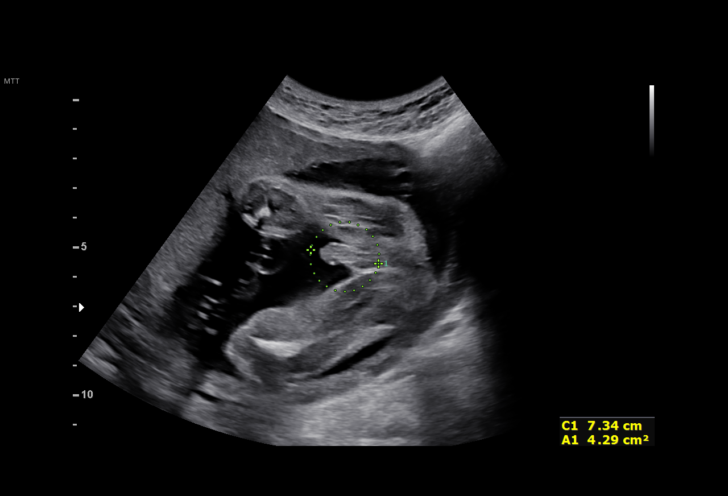
[im 64/67]
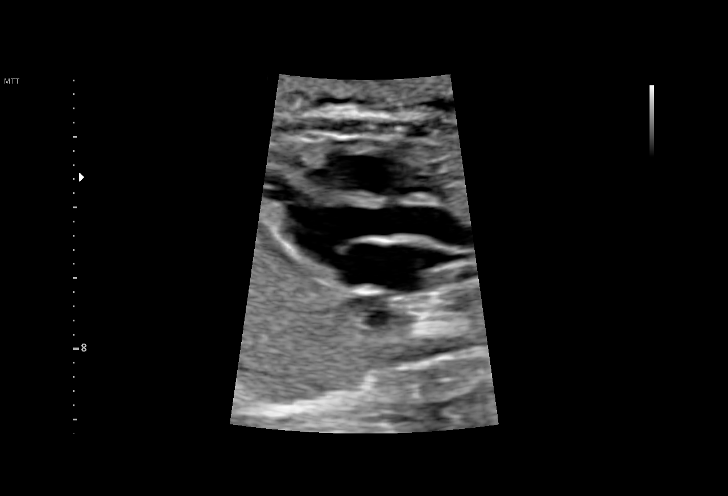

[13 of 28 positions shown; findings below may reference images not displayed]

XUSANBOY

Indications

 23 weeks gestation of pregnancy
 Encounter for other antenatal screening
 follow-up
 Antenatal follow-up for nonvisualized fetal
 anatomy
Fetal Evaluation

 Num Of Fetuses:         1
 Fetal Heart Rate(bpm):  155
 Cardiac Activity:       Observed
 Presentation:           Cephalic
 Placenta:               Anterior
 P. Cord Insertion:      Marginal insertion prev.

 Amniotic Fluid
 AFI FV:      Within normal limits

                             Largest Pocket(cm)

Biometry

 BPD:      58.3  mm     G. Age:  23w 6d         56  %    CI:        75.92   %    70 - 86
                                                         FL/HC:      19.0   %    18.7 -
 HC:      212.1  mm     G. Age:  23w 2d         23  %    HC/AC:      1.12        1.05 -
 AC:      189.1  mm     G. Age:  23w 5d         44  %    FL/BPD:     69.3   %    71 - 87
 FL:       40.4  mm     G. Age:  23w 0d         23  %    FL/AC:      21.4   %    20 - 24
 HUM:      37.2  mm     G. Age:  23w 0d         30  %
 CER:      26.5  mm     G. Age:  24w 2d         62  %
 LV:        5.8  mm
 CM:        7.5  mm

 Est. FW:     592  gm      1 lb 5 oz     34  %
OB History

 Gravidity:    1
Gestational Age

 LMP:           28w 1d        Date:  05/21/19                 EDD:   02/25/20
 U/S Today:     23w 3d                                        EDD:   03/29/20
 Best:          23w 4d     Det. By:  Previous Ultrasound      EDD:   03/28/20
                                     (09/04/19)
Anatomy

 Cranium:               Appears normal         LVOT:                   Appears normal
 Cavum:                 Previously seen        Aortic Arch:            Appears normal
 Ventricles:            Appears normal         Ductal Arch:            Appears normal
 Choroid Plexus:        Appears normal         Diaphragm:              Appears normal
 Cerebellum:            Appears normal         Stomach:                Appears normal, left
                                                                       sided
 Posterior Fossa:       Previously seen        Abdomen:                Appears normal
 Nuchal Fold:           Previously seen        Abdominal Wall:         Previously seen
 Face:                  Orbits and profile     Cord Vessels:           Previously seen
                        previously seen
 Lips:                  Previously seen        Kidneys:                Appear normal
 Palate:                Previously seen        Bladder:                Appears normal
 Thoracic:              Previously seen        Spine:                  Appears normal
 Heart:                 Appears normal         Upper Extremities:      Previously seen
                        (4CH, axis, and
                        situs)
 RVOT:                  Appears normal         Lower Extremities:      Previously seen

 Other:  Heels and 5th digit visualized prev. Open hands visualized prev.
         Nasal bone visualized prev. Male gender previously seen.
Cervix Uterus Adnexa

 Cervix
 Not adaquately visualized

 Uterus
 No abnormality visualized.

 Right Ovary
 Within normal limits. No adnexal mass visualized.

 Left Ovary
 Within normal limits. No adnexal mass visualized.

 Cul De Sac
 No free fluid seen.

 Adnexa
 No abnormality visualized.
Impression

 Patient returned for completion of fetal anatomy .Fetal
 biometry is consistent with her previously-established dates
 .Amniotic fluid is normal and good fetal activity is seen .Fetal
 anatomical survey (spine) was completed and appears
 normal. Marginal cord insertion is seen again.
Recommendations

 -An appointment was made for her to return in 5 weeks for
 fetal growth assessment. ed.
                      Mike, Donghyeon

## 2022-05-02 IMAGING — US US MFM OB FOLLOW-UP
1 series · 13 of 28 positions shown · non-contrast
Comparison: none

[Series 1: us mfm ob follow-up · 93 acquisitions, 13 frames shown]
[im 4/93]
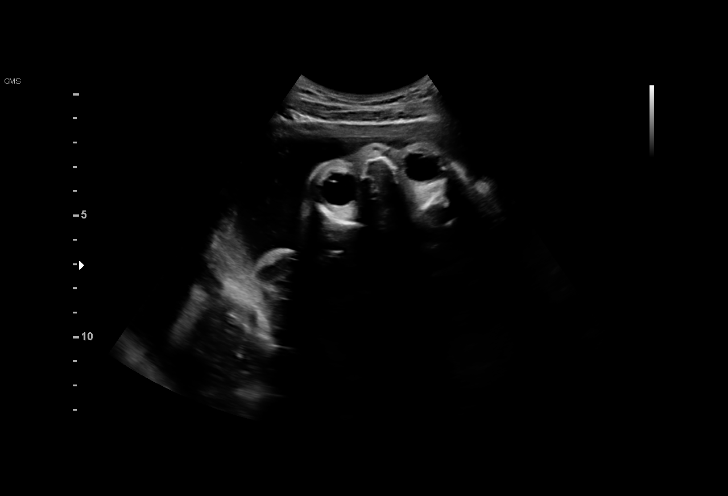
[im 11/93]
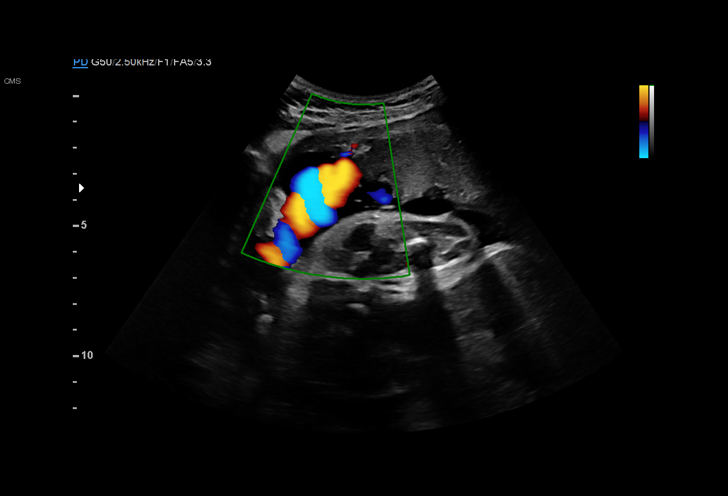
[im 18/93]
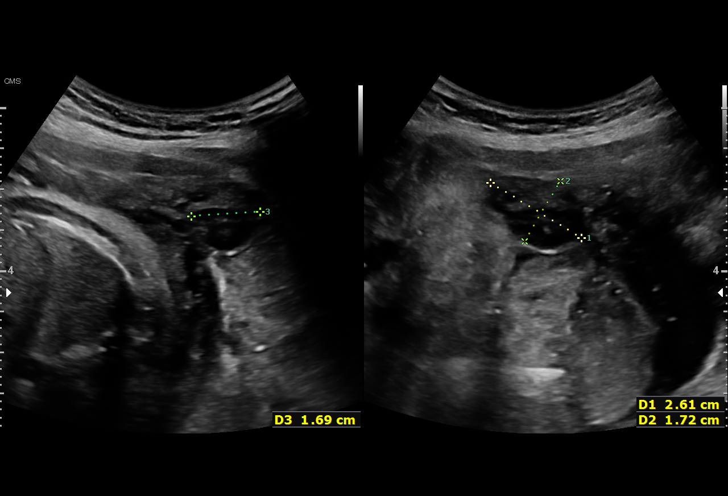
[im 24/93]
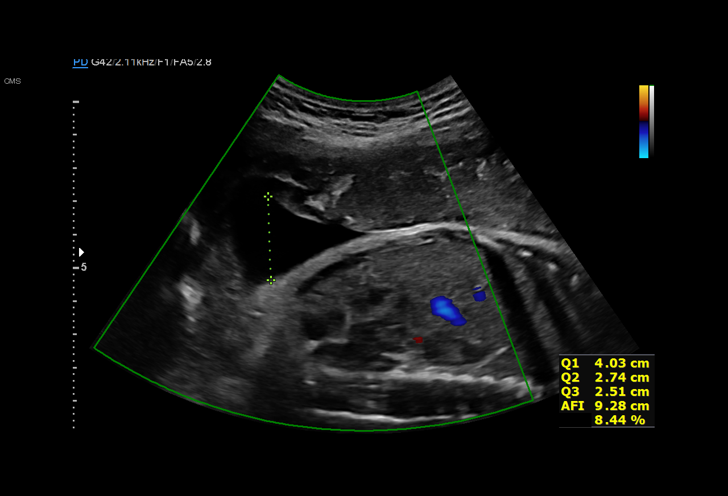
[im 31/93]
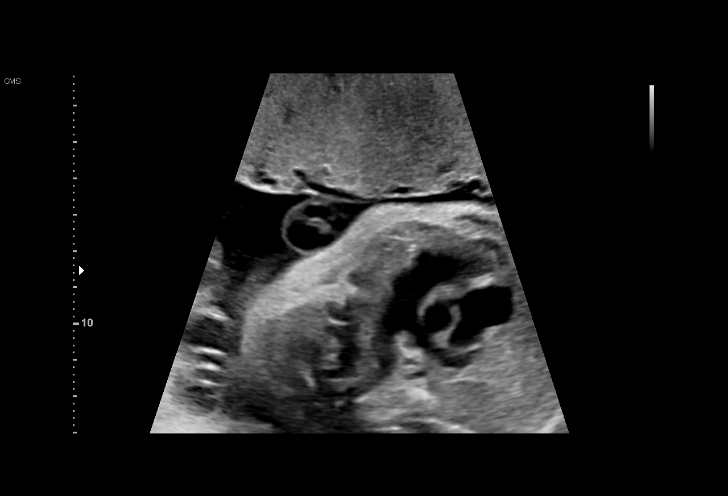
[im 38/93]
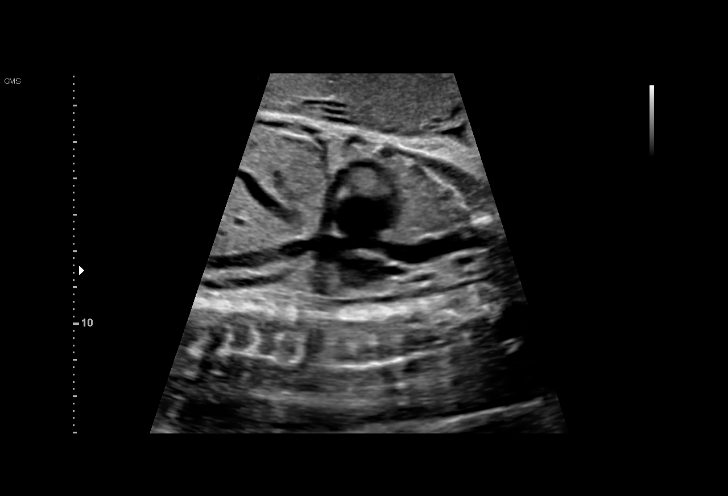
[im 48/93]
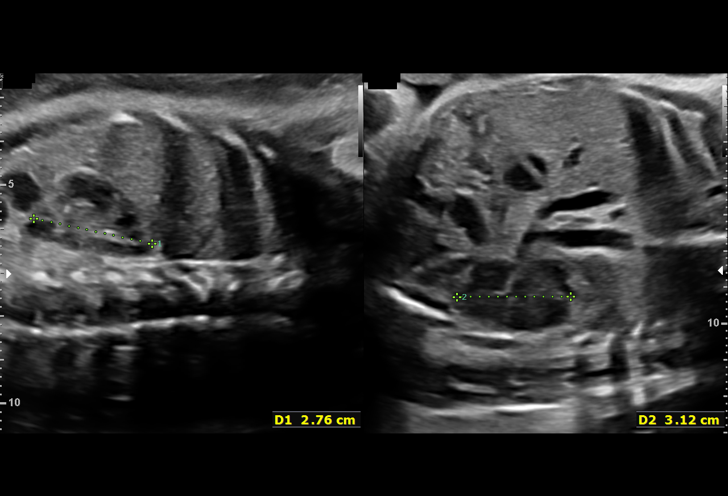
[im 55/93]
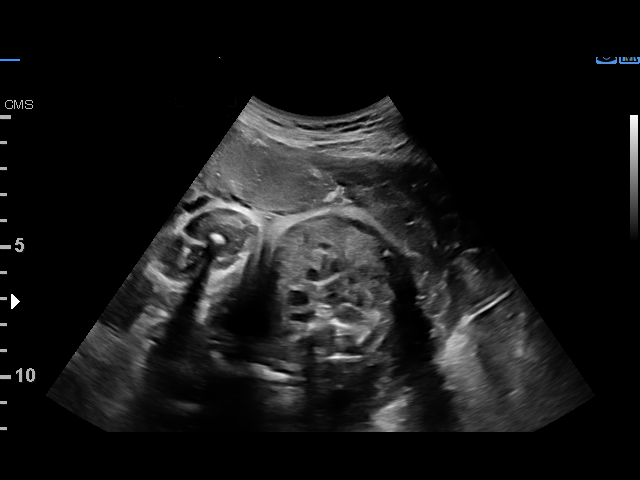
[im 62/93]
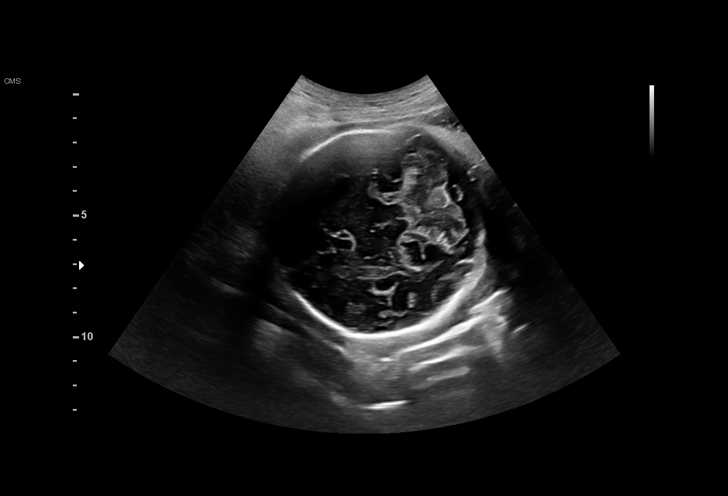
[im 69/93]
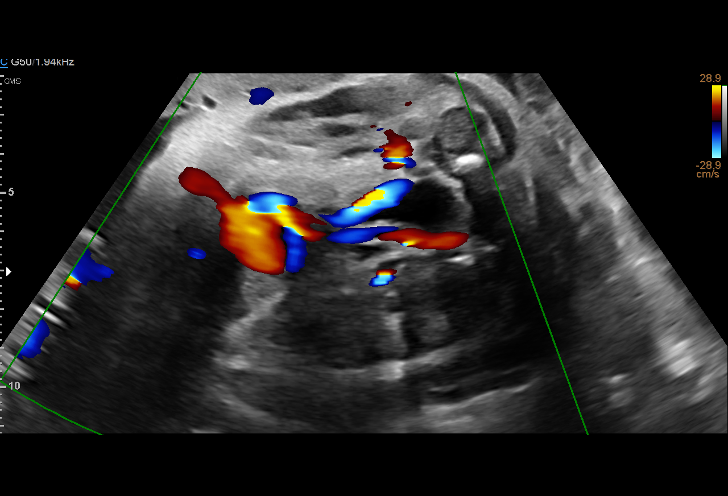
[im 75/93]
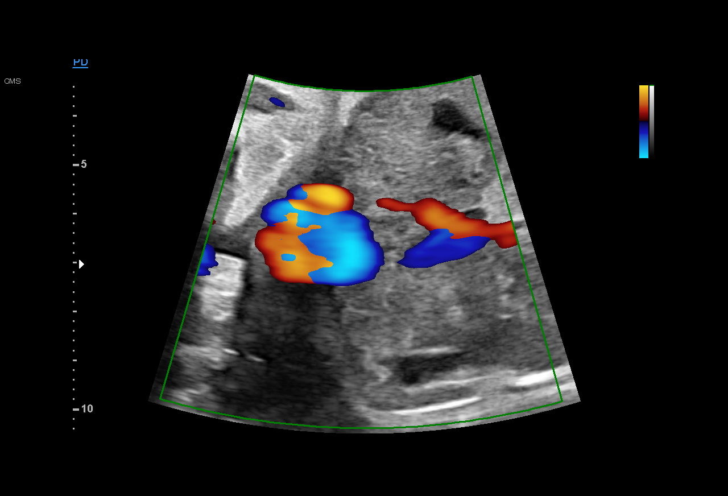
[im 82/93]
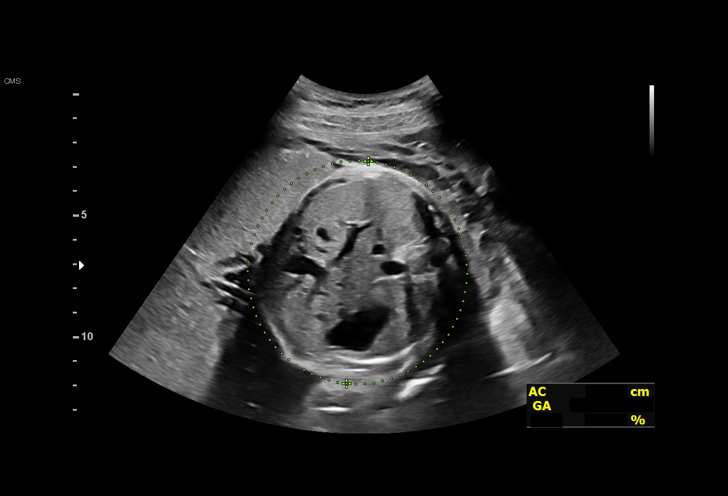
[im 89/93]
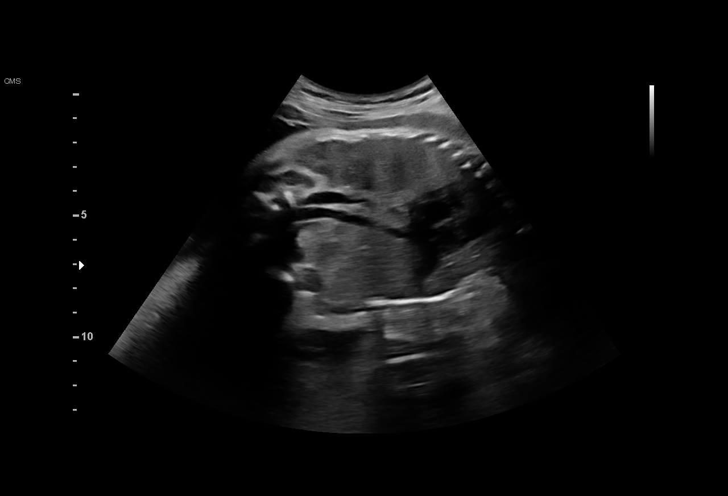

[13 of 28 positions shown; findings below may reference images not displayed]

JAYA

Indications

 Encounter for other antenatal screening
 follow-up
 Marginal insertion of umbilical cord affecting
 management of mother in third trimester
 Thrombocytopenia affecting pregnancy,          O99.119,
 antepartum
 33 weeks gestation of pregnancy
Fetal Evaluation

 Num Of Fetuses:         1
 Fetal Heart Rate(bpm):  143
 Cardiac Activity:       Observed
 Presentation:           Cephalic
 Placenta:               Anterior
 P. Cord Insertion:      Marginal insertion

 Amniotic Fluid
 AFI FV:      Within normal limits

 AFI Sum(cm)     %Tile       Largest Pocket(cm)
 10.5            22          4

 RUQ(cm)       RLQ(cm)       LUQ(cm)        LLQ(cm)
 4
Biometry

 BPD:      85.3  mm     G. Age:  34w 3d         82  %    CI:        77.47   %    70 - 86
                                                         FL/HC:      19.1   %    19.9 -
 HC:      306.8  mm     G. Age:  34w 1d         44  %    HC/AC:      1.08        0.96 -
 AC:      284.1  mm     G. Age:  32w 3d         34  %    FL/BPD:     68.7   %    71 - 87
 FL:       58.6  mm     G. Age:  30w 4d        1.9  %    FL/AC:      20.6   %    20 - 24
 HUM:      51.7  mm     G. Age:  30w 1d        < 5  %
 Est. FW:    3030  gm      4 lb 4 oz     20  %
OB History

 Gravidity:    1
Gestational Age

 LMP:           37w 4d        Date:  05/21/19                 EDD:   02/25/20
 U/S Today:     32w 6d                                        EDD:   03/29/20
 Best:          33w 0d     Det. By:  Previous Ultrasound      EDD:   03/28/20
                                     (09/04/19)
Anatomy

 Cranium:               Appears normal         LVOT:                   Appears normal
 Cavum:                 Appears normal         Aortic Arch:            Appears normal
 Ventricles:            Appears normal         Ductal Arch:            Appears normal
 Choroid Plexus:        Appears normal         Diaphragm:              Appears normal
 Cerebellum:            Appears normal         Stomach:                Appears normal, left
                                                                       sided
 Posterior Fossa:       Appears normal         Abdomen:                Giorgio vein varix;
                                                                       see comments
 Nuchal Fold:           Previously seen        Abdominal Wall:         Appears nml (cord
                                                                       insert, abd wall)
 Face:                  Appears normal         Cord Vessels:           Appears normal (3
                        (orbits and profile)                           vessel cord)
 Lips:                  Appears normal         Kidneys:                Appear normal
 Palate:                Previously seen        Bladder:                Appears normal
 Thoracic:              Previously seen        Spine:                  Previously seen
 Heart:                 Appears normal         Upper Extremities:      Previously seen
                        (4CH, axis, and
                        situs)
 RVOT:                  Appears normal         Lower Extremities:      Previously seen

 Other:  Heels and 5th digit visualized prev. Open hands visualized prev.
         Nasal bone visualized prev. Male gender previously seen.
Cervix Uterus Adnexa

 Cervix
 Not visualized (advanced GA >99wks)

 Uterus
 No abnormality visualized.

 Right Ovary
 Within normal limits.

 Left Ovary
 Within normal limits.

 Cul De Sac
 No free fluid seen.

 Adnexa
 No abnormality visualized.
Impression

 Marginal cord insertion. Patient returned for fetal growth
 assessment. She does not have gestational diabetes. Her
 blood pressures are within normal range at prenatal visits.
 Blood pressure today at her office is 120/81 mmHg.

 Fetal growth is appropriate for gestational age . Femur length
 measurement is at the 2nd percentile.Amniotic fluid is normal
 and good fetal activity is seen . Umbilical vein varix (UVV),
 measuring 1.5 cm, is seen. Liver appears slightly enlarged
 with prominent venous channels. IVC appears normal and its
 entry into the right atrium is clearly seen.

 I explained the finding of UVV and reassured her that in the
 absence of other anomalies we do not expect adverse fetal
 outcomes. Early term delivery is not indicated. I also informed
 the patient that prenatal diagnosis of prominent venous
 channels seen in the liver is not possible (may be normal)
 and I recommend postnatal ultrasound.
Recommendations

 -An appointment was made for her to return in 4 weeks for
 fetal growth assessment and to reevaluate UVV and liver.
 -Postnatal ultrasound of newborn's abdomen (to evaluate
 liver).
                 Jumper, Lorenz
